# Patient Record
Sex: Male | Born: 1945
Health system: Southern US, Community
[De-identification: ages and names within clinical notes are randomized; demographics above are authoritative.]

## PROBLEM LIST (undated history)

## (undated) DIAGNOSIS — E119 Type 2 diabetes mellitus without complications: Secondary | ICD-10-CM

## (undated) DIAGNOSIS — E785 Hyperlipidemia, unspecified: Secondary | ICD-10-CM

## (undated) DIAGNOSIS — M1990 Unspecified osteoarthritis, unspecified site: Secondary | ICD-10-CM

## (undated) DIAGNOSIS — I639 Cerebral infarction, unspecified: Secondary | ICD-10-CM

## (undated) DIAGNOSIS — R0602 Shortness of breath: Secondary | ICD-10-CM

## (undated) DIAGNOSIS — F32A Depression, unspecified: Secondary | ICD-10-CM

## (undated) DIAGNOSIS — F419 Anxiety disorder, unspecified: Secondary | ICD-10-CM

## (undated) DIAGNOSIS — Z972 Presence of dental prosthetic device (complete) (partial): Secondary | ICD-10-CM

## (undated) DIAGNOSIS — Z973 Presence of spectacles and contact lenses: Secondary | ICD-10-CM

## (undated) DIAGNOSIS — A770 Spotted fever due to Rickettsia rickettsii: Secondary | ICD-10-CM

## (undated) DIAGNOSIS — I10 Essential (primary) hypertension: Secondary | ICD-10-CM

## (undated) HISTORY — DX: Cerebral infarction, unspecified: I63.9

## (undated) HISTORY — DX: Hyperlipidemia, unspecified: E78.5

## (undated) HISTORY — DX: Type 2 diabetes mellitus without complications: E11.9

## (undated) HISTORY — PX: KNEE SURGERY: SHX244

## (undated) HISTORY — DX: Unspecified osteoarthritis, unspecified site: M19.90

---

## 2002-04-01 ENCOUNTER — Emergency Department (HOSPITAL_COMMUNITY): Admission: EM | Admit: 2002-04-01 | Discharge: 2002-04-01 | Payer: Self-pay | Admitting: Emergency Medicine

## 2002-09-04 ENCOUNTER — Inpatient Hospital Stay (HOSPITAL_COMMUNITY): Admission: EM | Admit: 2002-09-04 | Discharge: 2002-09-04 | Payer: Self-pay | Admitting: Emergency Medicine

## 2002-09-04 ENCOUNTER — Encounter: Payer: Self-pay | Admitting: Emergency Medicine

## 2005-01-24 ENCOUNTER — Emergency Department (HOSPITAL_COMMUNITY): Admission: EM | Admit: 2005-01-24 | Discharge: 2005-01-24 | Payer: Self-pay | Admitting: Family Medicine

## 2011-01-10 ENCOUNTER — Other Ambulatory Visit: Payer: Self-pay | Admitting: Family Medicine

## 2011-01-10 ENCOUNTER — Ambulatory Visit
Admission: RE | Admit: 2011-01-10 | Discharge: 2011-01-10 | Disposition: A | Discharge status: Home / Self Care | Payer: Medicare Other | Source: Ambulatory Visit | Attending: Family Medicine | Admitting: Family Medicine

## 2011-01-10 DIAGNOSIS — R0602 Shortness of breath: Secondary | ICD-10-CM

## 2011-07-12 ENCOUNTER — Encounter: Payer: Self-pay | Admitting: Internal Medicine

## 2011-07-15 ENCOUNTER — Encounter: Payer: Self-pay | Admitting: Internal Medicine

## 2011-07-15 ENCOUNTER — Ambulatory Visit: Payer: Medicare Other | Admitting: Internal Medicine

## 2011-07-15 ENCOUNTER — Ambulatory Visit (INDEPENDENT_AMBULATORY_CARE_PROVIDER_SITE_OTHER): Payer: Medicare Other | Admitting: Internal Medicine

## 2011-07-15 DIAGNOSIS — E785 Hyperlipidemia, unspecified: Secondary | ICD-10-CM

## 2011-07-15 DIAGNOSIS — R0789 Other chest pain: Secondary | ICD-10-CM

## 2011-07-15 DIAGNOSIS — I119 Hypertensive heart disease without heart failure: Secondary | ICD-10-CM

## 2011-07-15 NOTE — Progress Notes (Signed)
HPI  Patient notes feeling a sponge like  Sensation in chart.  Has slacked off some .  Comes on with and without activity. With exertion gets dizzy and SOB. 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  Lifts heavy things  Gets dizzy.  A little SOB.  No sponge like sensation Occasionally wakes up SOB.  Hx of DM x 15 years.  Diet cntrolled   No Known Allergies  Current Outpatient Prescriptions  Medication Sig Dispense Refill  . allopurinol (ZYLOPRIM) 300 MG tablet Take 300 mg by mouth daily.        Marland Kitchen amLODipine (NORVASC) 10 MG tablet Take 10 mg by mouth daily.        Marland Kitchen aspirin 81 MG tablet Take 81 mg by mouth daily.        . Calcium Carbonate-Vitamin D (CALCIUM-VITAMIN D) 500-200 MG-UNIT per tablet Take 1 tablet by mouth daily.        . carvedilol (COREG) 12.5 MG tablet Take 12.5 mg by mouth 2 (two) times daily with a meal.        . doxazosin (CARDURA) 8 MG tablet Take 16 mg by mouth at bedtime.        . furosemide (LASIX) 20 MG tablet Take 1-2 tabs daily as needed      . Multiple Vitamin (MULTIVITAMIN) tablet Take 1 tablet by mouth daily.        Marland Kitchen oxyCODONE-acetaminophen (PERCOCET) 5-325 MG per tablet Take 1 tablet by mouth every 4 (four) hours as needed.        . ramipril (ALTACE) 10 MG capsule Take 10 mg by mouth 2 (two) times daily.        . simvastatin (ZOCOR) 20 MG tablet Take 20 mg by mouth at bedtime.          No past medical history on file.  Past Surgical History  Procedure Date  . Knee surgery     right x3    Family History  Problem Relation Age of Onset  . Hypertension    . Cancer      History   Social History  . Marital Status: Married    Spouse Name: N/A    Number of Children: N/A  . Years of Education: N/A   Occupational History  . Not on file.   Social History Main Topics  . Smoking status: Never Smoker   . Smokeless tobacco: Current User  . Alcohol Use: Yes  . Drug Use: No  . Sexually Active: Not on file   Other Topics Concern  . Not on file   Social  History Narrative  . No narrative on file    Review of Systems:  All systems reviewed.  They are negative to the above problem except as previously stated.  Vital Signs: BP 117/59  Pulse 63  Ht 5\' 5"  (1.651 m)  Wt 194 lb (87.998 kg)  BMI 32.28 kg/m2  Physical Exam Patient is in NAD  HEENT:  Normocephalic, atraumatic. EOMI, PERRLA.  Neck: JVP is normal. No thyromegaly. No bruits.  Lungs: clear to auscultation. No rales no wheezes.  Heart: Regular rate and rhythm. Normal S1, S2. No S3.   No significant murmurs. PMI not displaced.  Abdomen:  Supple, nontender. Normal bowel sounds. No masses. No hepatomegaly.  Extremities:   Good distal pulses throughout. No lower extremity edema.  Musculoskeletal :moving all extremities.  Neuro:   alert and oriented x3.  CN II-XII grossly intact.  EKG:  NSR>  63 bpm.  Assessment and Plan:

## 2011-07-15 NOTE — Patient Instructions (Signed)
Your physician has requested that you have en exercise stress myoview. For further information please visit [email or web address]. Please follow instruction sheet, as given.   

## 2011-07-22 ENCOUNTER — Ambulatory Visit (HOSPITAL_COMMUNITY): Payer: Medicare Other | Attending: Internal Medicine | Admitting: Radiology

## 2011-07-22 DIAGNOSIS — R0789 Other chest pain: Secondary | ICD-10-CM

## 2011-07-22 DIAGNOSIS — I959 Hypotension, unspecified: Secondary | ICD-10-CM

## 2011-07-22 DIAGNOSIS — R079 Chest pain, unspecified: Secondary | ICD-10-CM | POA: Insufficient documentation

## 2011-07-22 DIAGNOSIS — R0989 Other specified symptoms and signs involving the circulatory and respiratory systems: Secondary | ICD-10-CM

## 2011-07-22 MED ORDER — SODIUM CHLORIDE 0.9 % IV SOLN: INTRAVENOUS | Status: DC

## 2011-07-22 MED ORDER — TECHNETIUM TC 99M TETROFOSMIN IV KIT
33.0000 | PACK | Freq: Once | INTRAVENOUS | Status: AC | PRN
  Administered 2011-07-22: 33 via INTRAVENOUS

## 2011-07-22 MED ORDER — TECHNETIUM TC 99M TETROFOSMIN IV KIT
11.0000 | PACK | Freq: Once | INTRAVENOUS | Status: AC | PRN
  Administered 2011-07-22: 11 via INTRAVENOUS

## 2011-07-22 MED ORDER — REGADENOSON 0.4 MG/5ML IV SOLN
0.4000 mg | Freq: Once | INTRAVENOUS | Status: AC
  Administered 2011-07-22: 0.4 mg via INTRAVENOUS

## 2011-07-22 NOTE — Progress Notes (Signed)
[facility] SITE 3 NUCLEAR MED [address] [place] [address] [phone]  Cardiology Nuclear Med Study  Luis Kim is a 65 y.o. male [id number] 02-Mar-1946   Nuclear Med Background Indication for Stress Test:  Evaluation for Ischemia History:  No previous documented CAD Cardiac Risk Factors: Hypertension, NIDDM, Obesity and Smoker  Symptoms:  Chest Pressure.  (last episode of chest discomfort was about 3-days ago), Diaphoresis, Dizziness, DOE/SOB, Fatigue, Near Syncope and Palpitations    Nuclear Pre-Procedure Caffeine/Decaff Intake:  None NPO After: 6:00 pm   Lungs:  Clear. IV 0.9% NS with Angio Cath:  20g  IV Site: R Hand  IV Started by:  Cathlyn Parsons, RN  Chest Size (in):  42 Cup Size: n/a  Height: 5\' 5"  (1.651 m)  Weight:  192 lb (87.091 kg)  BMI:  Body mass index is 31.95 kg/(m^2). Tech Comments:  Carvedilol held x 24 hrs    Nuclear Med Study 1 or 2 day study: 1 day  Stress Test Type:  Treadmill/Lexiscan  Reading MD: Willa Rough, MD  Order Authorizing Provider:  Dietrich Pates, MD  Resting Radionuclide: Technetium 63m Tetrofosmin  Resting Radionuclide Dose: 11.0 mCi   Stress Radionuclide:  Technetium 77m Tetrofosmin  Stress Radionuclide Dose: 33.0 mCi           Stress Protocol Rest HR: 61 Stress HR: 120  Rest BP: 120/64 Stress BP: 160/59  Exercise Time (min): 7:30 METS: 6.9   Predicted Max HR: 154 bpm % Max HR: 77.92 bpm Rate Pressure Product: 46962   Dose of Adenosine (mg):  n/a Dose of Lexiscan: 0.4 mg  Dose of Atropine (mg): n/a Dose of Dobutamine: n/a mcg/kg/min (at max HR)  Stress Test Technologist: Smiley Houseman, CMA-N  Nuclear Technologist:  Doyne Keel, CNMT     Rest Procedure:  Myocardial perfusion imaging was performed at rest 45 minutes following the intravenous administration of Technetium 69m Tetrofosmin.  Rest ECG: Minor, nonspecific ST changes.  Stress Procedure:  The patient attempted to walk the treadmill  for 5:31 utilizing the Bruce protocol, but was unable to obtain his target heart rate due to moderate dyspnea, O2 Sat was 96%.  He then received IV Lexiscan 0.4 mg over 15-seconds with concurrent low level exercise and then Technetium 73m Tetrofosmin was injected at 30-seconds while the patient continued walking one more minute.  There were no diagnostic ST-T wave changes with Lexiscan.  He did c/o chest tightness, 4/10 with Lexiscan.  He had a hypotensive response in recovery, that was relieved with 500 cc of normal saline.  Quantitative spect images were obtained after a 45-minute delay.  Stress ECG: No significant change from baseline ECG  QPS Raw Data Images:  Patient motion noted; appropriate software correction applied. Stress Images:  Normal homogeneous uptake in all areas of the myocardium. Rest Images:  Normal homogeneous uptake in all areas of the myocardium. Subtraction (SDS):  No evidence of ischemia. Transient Ischemic Dilatation (Normal <1.22):  1.10 Lung/Heart Ratio (Normal <0.45):  0.23  Quantitative Gated Spect Images QGS EDV:  104 ml QGS ESV:  30 ml QGS cine images:  Normal Wall Motion QGS EF: 71%  Impression Exercise Capacity:  Lexiscan with low level exercise. BP Response:  Hypotensive blood pressure response. Clinical Symptoms:  Chest tight ECG Impression:  No significant ST segment change suggestive of ischemia. Comparison with Prior Nuclear Study: No previous nuclear study performed  Overall Impression:  There is no significant EKG abnormality or nuclear abnormality. There  was significant drop in BP post stress. With BP as low as 72/45 with standing, IV fluids were given. Pt stabilized and was allowed to go home. The study had been changed from walking to [place].   Willa Rough

## 2011-07-23 ENCOUNTER — Telehealth: Payer: Self-pay | Admitting: Internal Medicine

## 2011-07-23 ENCOUNTER — Other Ambulatory Visit (HOSPITAL_COMMUNITY): Payer: Self-pay | Admitting: Radiology

## 2011-07-23 DIAGNOSIS — R0602 Shortness of breath: Secondary | ICD-10-CM

## 2011-07-23 DIAGNOSIS — R0789 Other chest pain: Secondary | ICD-10-CM | POA: Insufficient documentation

## 2011-07-23 DIAGNOSIS — E785 Hyperlipidemia, unspecified: Secondary | ICD-10-CM | POA: Insufficient documentation

## 2011-07-23 NOTE — Assessment & Plan Note (Signed)
On statin.  Will need to be followed.

## 2011-07-23 NOTE — Telephone Encounter (Signed)
Test results

## 2011-07-23 NOTE — Telephone Encounter (Signed)
Pt's daughter calling, quite upset, that we could not give her mother,  results of her father's stress test--advised we are not supposed to give results until attending MD has read--daughter continued to state her father had a reaction during the procedure and she wants to know what's going on--after pulling out results of stress test(which shows no evidence of ischemia) i told daughter i would call pt back and tell them results of testing--daughter agrees---nt

## 2011-07-23 NOTE — Assessment & Plan Note (Signed)
I am not convinced the patient's spells represent angina.  I would, with his medical history, set up for a stress myoview to rule out ischemia.  No changes for now.

## 2011-07-23 NOTE — Telephone Encounter (Signed)
Patient would like to know the [facility] results. Test was done yesterday 07/22/11. Pt's wife was made aware that MD needs to review test and make recommendations if needed. We will call them back when is done. Wife verbalized understanding.

## 2011-07-31 NOTE — Telephone Encounter (Signed)
Set up for PFTs.

## 2011-07-31 NOTE — Telephone Encounter (Signed)
Called patient's home and spoke with his wife. He was at work. She is aware for the need for PFT. Will send order for PFT's to [facility]. She will let the daughter know about the plan.

## 2011-07-31 NOTE — Telephone Encounter (Signed)
Patient daughter calling back.

## 2011-07-31 NOTE — Telephone Encounter (Signed)
Pt daughter calling wanting to know more about stress test . Pt daughter wants to know what the plan is from this point forward. Please return call to advise/discuss.

## 2011-08-05 ENCOUNTER — Encounter: Payer: Self-pay | Admitting: Internal Medicine

## 2011-08-05 ENCOUNTER — Telehealth: Payer: Self-pay | Admitting: Internal Medicine

## 2011-08-05 ENCOUNTER — Ambulatory Visit (INDEPENDENT_AMBULATORY_CARE_PROVIDER_SITE_OTHER): Payer: Medicare Other | Admitting: Internal Medicine

## 2011-08-05 DIAGNOSIS — R0989 Other specified symptoms and signs involving the circulatory and respiratory systems: Secondary | ICD-10-CM

## 2011-08-05 DIAGNOSIS — R5383 Other fatigue: Secondary | ICD-10-CM

## 2011-08-05 DIAGNOSIS — R0602 Shortness of breath: Secondary | ICD-10-CM

## 2011-08-05 LAB — PULMONARY FUNCTION TEST

## 2011-08-05 NOTE — Telephone Encounter (Signed)
Kathy(daughter) had left the house. I spoke with patient's wife. She wanted to let us know that he had his PFT's done today. Advised will call back when results are available.

## 2011-08-05 NOTE — Progress Notes (Signed)
PFT done today. 

## 2011-08-05 NOTE — Telephone Encounter (Signed)
Test result

## 2011-08-06 ENCOUNTER — Encounter: Payer: Self-pay | Admitting: Internal Medicine

## 2011-08-08 ENCOUNTER — Ambulatory Visit: Payer: Medicare Other | Admitting: Internal Medicine

## 2011-08-08 ENCOUNTER — Telehealth: Payer: Self-pay | Admitting: Internal Medicine

## 2011-08-08 NOTE — Telephone Encounter (Signed)
PFT not scanned in yet. CDY, have you read this yet? Looks like it was done on 08/05/11. Please advise, thanks!

## 2011-08-08 NOTE — Telephone Encounter (Signed)
DAUGHTER CALLED BACK AGAIN. 'VERY UPSET" THAT IT HAS TAKEN THIS LONG TO GET PFT RESULTS TO DR. Tenny Craw. SHE WILL CALL BACK TOMORROW IN THE MORNING "TO SEE WHAT CAN BE DONE THEN". Luis Kim

## 2011-08-09 ENCOUNTER — Telehealth: Payer: Self-pay | Admitting: Internal Medicine

## 2011-08-09 ENCOUNTER — Encounter: Payer: Self-pay | Admitting: *Deleted

## 2011-08-09 NOTE — Telephone Encounter (Signed)
Dr.Ross spoke with patient's daughter concerning results of PFT's and need for right and left heart cath. I called patient's wife with all instructions. He will come in for labwork on 9/17.

## 2011-08-09 NOTE — Telephone Encounter (Signed)
Reviewed tests results with wife. Echo from outside showed normal LV systolic function.  Mild diastolic dysfunction Myoview:  Got HR to 120.  Not at target so switched to lexiscan.  No evidence of chronotropic incompetence PFTs showed very mild obstructive airway disease that did though have signif response to bronchodilator  Wife reports patient's continued symptoms.  Did say that one day he did look ashen.   Ques if all could be related to agent orange exposure.  Noninvasive tests do not sugg a signif problem, esp to make him look "ashen" at times. Family is very worried. Only way to really confirm would be to do a left and right heart catheterization. Wife agrees (patient at work)  Set up pre cath labs then schedule.  Check TSH as well. If that is neg could consider CT to r/o PE though prob is very low.

## 2011-08-09 NOTE — Telephone Encounter (Signed)
Error.Luis Kim ° °

## 2011-08-09 NOTE — Telephone Encounter (Signed)
Pt daughter called again wants pft results.Luis Kim

## 2011-08-09 NOTE — Telephone Encounter (Signed)
Advised the pt daughter that we have refaxed the pft results to Dr. Tenny Craw and received a confirmation from fax that is went through. She states she will call Dr. Tenny Craw now. Carron Curie, CMA

## 2011-08-09 NOTE — Telephone Encounter (Signed)
Pt daughter calling to report that she spoke with pulmonary technician regarding pt pulmonary function test and stated . Pt daughter would like results of that test discussed with pt wife. Please call pt wife to discuss test results.

## 2011-08-09 NOTE — Telephone Encounter (Signed)
Spoke with patient's wife again. He will come in for lab work on 9/19 and have heart cath on 9/20.

## 2011-08-14 ENCOUNTER — Encounter: Payer: Self-pay | Admitting: Internal Medicine

## 2011-08-14 ENCOUNTER — Other Ambulatory Visit (INDEPENDENT_AMBULATORY_CARE_PROVIDER_SITE_OTHER): Payer: Medicare Other | Admitting: *Deleted

## 2011-08-14 DIAGNOSIS — R079 Chest pain, unspecified: Secondary | ICD-10-CM

## 2011-08-14 DIAGNOSIS — R0602 Shortness of breath: Secondary | ICD-10-CM

## 2011-08-14 DIAGNOSIS — R5383 Other fatigue: Secondary | ICD-10-CM

## 2011-08-14 LAB — CBC WITH DIFFERENTIAL/PLATELET
Basophils Absolute: 0 10*3/uL (ref 0.0–0.1)
Eosinophils Absolute: 0.2 10*3/uL (ref 0.0–0.7)
Hemoglobin: 12.5 g/dL — ABNORMAL LOW (ref 13.0–17.0)
Lymphocytes Relative: 18.2 % (ref 12.0–46.0)
MCHC: 34.1 g/dL (ref 30.0–36.0)
Monocytes Relative: 8.2 % (ref 3.0–12.0)
Neutro Abs: 5.3 10*3/uL (ref 1.4–7.7)
Neutrophils Relative %: 70.2 % (ref 43.0–77.0)
RDW: 13.6 % (ref 11.5–14.6)

## 2011-08-14 LAB — PROTIME-INR
INR: 0.9 ratio (ref 0.8–1.0)
Prothrombin Time: 10.6 s (ref 10.2–12.4)

## 2011-08-14 LAB — BASIC METABOLIC PANEL
CO2: 27 mEq/L (ref 19–32)
Calcium: 8.4 mg/dL (ref 8.4–10.5)
Chloride: 103 mEq/L (ref 96–112)
Sodium: 138 mEq/L (ref 135–145)

## 2011-08-14 LAB — SEDIMENTATION RATE: Sed Rate: 36 mm/hr — ABNORMAL HIGH (ref 0–22)

## 2011-08-14 LAB — TSH: TSH: 0.99 u[IU]/mL (ref 0.35–5.50)

## 2011-08-14 LAB — APTT: aPTT: 25.4 s (ref 21.7–28.8)

## 2011-08-15 ENCOUNTER — Inpatient Hospital Stay (HOSPITAL_BASED_OUTPATIENT_CLINIC_OR_DEPARTMENT_OTHER)
Admission: RE | Admit: 2011-08-15 | Discharge: 2011-08-15 | Disposition: A | Discharge status: Home / Self Care | Payer: Medicare Other | Source: Ambulatory Visit | Attending: Cardiovascular Disease | Admitting: Cardiovascular Disease

## 2011-08-15 DIAGNOSIS — I2510 Atherosclerotic heart disease of native coronary artery without angina pectoris: Secondary | ICD-10-CM | POA: Insufficient documentation

## 2011-08-15 DIAGNOSIS — R079 Chest pain, unspecified: Secondary | ICD-10-CM | POA: Insufficient documentation

## 2011-08-16 NOTE — Cardiovascular Report (Signed)
NAMEMarland Kim  [place], CARRAS NO.:  [id number]  MEDICAL RECORD NO.:  [id number]  LOCATION:                                 FACILITY:  PHYSICIAN:  Veverly Fells. Excell Seltzer, MD  DATE OF BIRTH:  1945/12/02  DATE OF PROCEDURE:  08/15/2011 DATE OF DISCHARGE:                           CARDIAC CATHETERIZATION   PROCEDURES:  Right heart catheterization, left heart catheterization, selective coronary angiography, left ventricular angiography.  PROCEDURAL INDICATION:  Mr. [name] is a 65 year old gentleman with longstanding diabetes, hypertension, and hyperlipidemia.  He has had an unremarkable noninvasive workup but has continued symptoms which are very bothersome, and he was referred for definitive diagnosis.  Right and left heart cath was requested.  Risks and indications of the procedure were reviewed with the patient, informed consent was obtained.  The right groin was prepped, draped, and anesthetized with 1% lidocaine.  Using modified Seldinger technique, a 4- French sheath was placed in the right femoral artery and a 6-French sheath was placed in the right femoral vein.  Standard Judkins catheters were used for coronary angiography and left ventriculography.  A multipurpose catheter was used for the right heart catheterization. There were no immediate complications.  PROCEDURAL FINDINGS:  Hemodynamics:  Right atrial pressure mean of 7, right ventricular pressure 27/10, pulmonary artery pressure 25/10 with a mean of 17, pulmonary capillary wedge pressure mean of 15, aortic pressure 125/56 with a mean of 83, left ventricular pressure is 121/70.  Oxygen saturations:  Aortic saturation is 96%.  Pulmonary artery saturation is 73%.  Cardiac output is 6.2 liters per minute.  Cardiac index is 3.2 liters per minute per meter squared.  Angiography:  Left ventriculography shows normal left ventricular systolic function.  The ejection fraction is estimated at 60-65%.  There are  no regional wall motion abnormalities identified.  Coronary angiography:  The left mainstem is widely patent.  There is no significant obstructive disease.  The left main trifurcates into the LAD, an intermediate branch, and the left circumflex.  LAD.  The LAD is a large-caliber vessel proximally and tapers after the first diagonal to a moderate-caliber vessel that wraps around the LV apex.  There is no significant obstructive disease throughout the course of the LAD distribution or its diagonal branches.  Left circumflex.  There is a small to moderate caliber intermediate branch with no significant stenosis.  The AV groove circumflex is large. It gives off a tiny first OM and a moderate-caliber third OM.  The OM branches in the AV groove circumflex are all widely patent without significant obstructive disease.  Right coronary artery.  The RCA is dominant.  There is mild diffuse plaque through the mid vessel with 20-30% stenosis.  The proximal and distal vessel are widely patent.  The vessel terminates in a large PDA branch with no significant obstructive disease.  FINAL ASSESSMENT: 1. Minimal nonobstructive coronary artery disease with 20-30% stenosis     in the mid right coronary artery and widely patent left main, left     anterior descending, and left circumflex. 2. Normal left ventricular systolic function. 3. Normal right heart hemodynamics with normal intracardiac filling     pressures.     [name]  Karren Burly, MD     MDC/MEDQ  D:  08/15/2011  T:  08/15/2011  Job:  [id number]  cc:   Pricilla Riffle, MD, [facility] Burnell Blanks, MD  Electronically Signed by Tonny Bollman MD on 08/16/2011 09:19:05 PM

## 2011-08-19 LAB — POCT I-STAT 3, VENOUS BLOOD GAS (G3P V)
TCO2: 30 mmol/L (ref 0–100)
pCO2, Ven: 46.1 mmHg (ref 45.0–50.0)
pH, Ven: 7.398 — ABNORMAL HIGH (ref 7.250–7.300)
pO2, Ven: 39 mmHg (ref 30.0–45.0)

## 2011-08-19 LAB — POCT I-STAT 3, ART BLOOD GAS (G3+)
Bicarbonate: 27.8 mEq/L — ABNORMAL HIGH (ref 20.0–24.0)
pO2, Arterial: 79 mmHg — ABNORMAL LOW (ref 80.0–100.0)

## 2011-08-26 ENCOUNTER — Other Ambulatory Visit: Payer: Self-pay | Admitting: *Deleted

## 2011-08-26 ENCOUNTER — Telehealth: Payer: Self-pay | Admitting: Internal Medicine

## 2011-08-26 DIAGNOSIS — I2510 Atherosclerotic heart disease of native coronary artery without angina pectoris: Secondary | ICD-10-CM

## 2011-08-26 DIAGNOSIS — R0602 Shortness of breath: Secondary | ICD-10-CM

## 2011-08-26 NOTE — Telephone Encounter (Signed)
Pt wife returning call from [place] from Friday. Please return call to discuss further.

## 2011-08-26 NOTE — Telephone Encounter (Signed)
Called patient's wife. He will come for labs on 10/8 in this office.

## 2011-08-26 NOTE — Telephone Encounter (Signed)
Called patient's wife and advised of the need for more lab work. She will call me back to let me know when he can come in.

## 2011-08-26 NOTE — Telephone Encounter (Signed)
Pt's wife called and said that husband will come in for labs on Monday Oct 8 in the am.

## 2011-09-02 ENCOUNTER — Other Ambulatory Visit (INDEPENDENT_AMBULATORY_CARE_PROVIDER_SITE_OTHER): Payer: Medicare Other | Admitting: *Deleted

## 2011-09-02 DIAGNOSIS — R0602 Shortness of breath: Secondary | ICD-10-CM

## 2011-09-02 DIAGNOSIS — I2510 Atherosclerotic heart disease of native coronary artery without angina pectoris: Secondary | ICD-10-CM

## 2011-09-02 LAB — CBC WITH DIFFERENTIAL/PLATELET
Basophils Absolute: 0 10*3/uL (ref 0.0–0.1)
Eosinophils Relative: 3.4 % (ref 0.0–5.0)
HCT: 37.2 % — ABNORMAL LOW (ref 39.0–52.0)
Hemoglobin: 12.7 g/dL — ABNORMAL LOW (ref 13.0–17.0)
Lymphs Abs: 1.4 10*3/uL (ref 0.7–4.0)
Monocytes Relative: 6.6 % (ref 3.0–12.0)
Neutro Abs: 6.4 10*3/uL (ref 1.4–7.7)
RDW: 13.2 % (ref 11.5–14.6)

## 2011-09-05 ENCOUNTER — Telehealth: Payer: Self-pay | Admitting: Internal Medicine

## 2011-09-05 NOTE — Telephone Encounter (Signed)
Pt was called by someone and they don't know who or why.  He has an appt. Tomorrow but just was concerned that they might have been calling with results of lab.  Please call cell [phone]

## 2011-09-05 NOTE — Telephone Encounter (Signed)
Spoke with patient's daughter. See note attached to labs from 10/9.

## 2011-09-06 ENCOUNTER — Encounter: Payer: Self-pay | Admitting: Internal Medicine

## 2011-09-06 ENCOUNTER — Ambulatory Visit (INDEPENDENT_AMBULATORY_CARE_PROVIDER_SITE_OTHER): Payer: Medicare Other | Admitting: Internal Medicine

## 2011-09-06 DIAGNOSIS — R0789 Other chest pain: Secondary | ICD-10-CM

## 2011-09-06 DIAGNOSIS — R0989 Other specified symptoms and signs involving the circulatory and respiratory systems: Secondary | ICD-10-CM

## 2011-09-06 DIAGNOSIS — R0602 Shortness of breath: Secondary | ICD-10-CM

## 2011-09-06 DIAGNOSIS — I119 Hypertensive heart disease without heart failure: Secondary | ICD-10-CM

## 2011-09-06 DIAGNOSIS — R0609 Other forms of dyspnea: Secondary | ICD-10-CM

## 2011-09-06 LAB — BASIC METABOLIC PANEL
BUN: 26 mg/dL — ABNORMAL HIGH (ref 6–23)
Chloride: 103 mEq/L (ref 96–112)
Glucose, Bld: 125 mg/dL — ABNORMAL HIGH (ref 70–99)
Potassium: 4 mEq/L (ref 3.5–5.1)

## 2011-09-06 LAB — LIPID PANEL
HDL: 71 mg/dL (ref >39.00)
LDL Cholesterol: 50 mg/dL (ref 0–99)
Total CHOL/HDL Ratio: 2
Triglycerides: 128 mg/dL (ref 0.0–149.0)
VLDL: 25.6 mg/dL (ref 0.0–40.0)

## 2011-09-06 NOTE — Patient Instructions (Signed)
Refer to Dr.Clint Young for Pulmonary Consult for SOB.  Lab work today. We will call you with results.

## 2011-09-06 NOTE — Progress Notes (Signed)
HPI Patient is a 65 year old who I saw for the first time in August.  He had complained of SOB and squishy sensation and chest tightness. He had an echo at an outside institution that showed normal LVEF with mild diastolic dysfunction.  I  I saw him and scheduled a myoview that was normal.  He then had  PFTs that showed only mild reactive airways.    Because his wife and daughter said that his spells were so severe I recomm labs.  D dimer was normal  WESR was mildly elevated.  I also recomm a cardiac cat to fully define.  This showed no significant CAD and normal R heart pressures.Marland Kitchen SInce seen he has had continued episodes where he has to take a bigger breath.  Gives out easier.   No Known Allergies  Current Outpatient Prescriptions  Medication Sig Dispense Refill  . allopurinol (ZYLOPRIM) 300 MG tablet Take 300 mg by mouth daily.        Marland Kitchen amLODipine (NORVASC) 10 MG tablet Take 10 mg by mouth daily.        Marland Kitchen aspirin 81 MG tablet Take 81 mg by mouth daily.        . Calcium Carbonate-Vitamin D (CALCIUM-VITAMIN D) 500-200 MG-UNIT per tablet Take 1 tablet by mouth daily.        . carvedilol (COREG) 12.5 MG tablet Take 12.5 mg by mouth 2 (two) times daily with a meal.        . doxazosin (CARDURA) 8 MG tablet Take 16 mg by mouth at bedtime.        . furosemide (LASIX) 20 MG tablet Take 1-2 tabs daily as needed      . Multiple Vitamin (MULTIVITAMIN) tablet Take 1 tablet by mouth daily.        Marland Kitchen oxyCODONE-acetaminophen (PERCOCET) 5-325 MG per tablet Take 1 tablet by mouth every 4 (four) hours as needed.        . ramipril (ALTACE) 10 MG capsule Take 10 mg by mouth 2 (two) times daily.        . simvastatin (ZOCOR) 20 MG tablet Take 20 mg by mouth at bedtime.          No past medical history on file.  Past Surgical History  Procedure Date  . Knee surgery     right x3    Family History  Problem Relation Age of Onset  . Hypertension    . Cancer      History   Social History  . Marital  Status: Married    Spouse Name: N/A    Number of Children: N/A  . Years of Education: N/A   Occupational History  . Not on file.   Social History Main Topics  . Smoking status: Never Smoker   . Smokeless tobacco: Current User  . Alcohol Use: Yes  . Drug Use: No  . Sexually Active: Not on file   Other Topics Concern  . Not on file   Social History Narrative  . No narrative on file    Review of Systems:  All systems reviewed.  They are negative to the above problem except as previously stated.  Vital Signs: BP 118/62  Pulse 75  Ht 5\' 5"  (1.651 m)  Wt 195 lb (88.451 kg)  BMI 32.45 kg/m2  Physical Exam  Patietnt is in  NAD  HEENT:  Normocephalic, atraumatic. EOMI, PERRLA.  Neck: JVP is normal. No thyromegaly. No bruits.  Lungs: clear to auscultation. No  rales no wheezes.  Heart: Regular rate and rhythm. Normal S1, S2. No S3.   No significant murmurs. PMI not displaced.  Abdomen:  Supple, nontender. Normal bowel sounds. No masses. No hepatomegaly.  Extremities:   Good distal pulses throughout. No lower extremity edema.  Musculoskeletal :moving all extremities.  Neuro:   alert and oriented x3.  CN II-XII grossly intact.   Assessment and Plan:

## 2011-09-08 NOTE — Assessment & Plan Note (Signed)
I am not sure what is causing the patient's symptoms.  No signif CAD or pulmonary HTN.  D dimer was normal.  ESR was minimally elevated.  PFTs with only mild abnormality The patient and his family (wife and daughter) remain anxious.  Will repeat ESR>  I have asked them to get echo disk for review. I would also recomm eval in pulmonary. I have encouraged him to lose wt as probably will help symptoms.

## 2011-09-12 ENCOUNTER — Telehealth: Payer: Self-pay | Admitting: Internal Medicine

## 2011-09-12 NOTE — Telephone Encounter (Signed)
Daughter calls today about ? Test results pt had recently.  She said her father wasn't sure what tests he had. Lab results given to daughter. Mylo Red RN

## 2011-09-12 NOTE — Telephone Encounter (Signed)
Pt's dtr calling for pt re results, pt didn't understand

## 2011-10-15 ENCOUNTER — Ambulatory Visit (INDEPENDENT_AMBULATORY_CARE_PROVIDER_SITE_OTHER): Payer: Medicare Other | Admitting: Internal Medicine

## 2011-10-15 ENCOUNTER — Encounter: Payer: Self-pay | Admitting: Internal Medicine

## 2011-10-15 VITALS — BP 122/62 | HR 73 | Ht 65.0 in | Wt 195.8 lb

## 2011-10-15 DIAGNOSIS — R0789 Other chest pain: Secondary | ICD-10-CM

## 2011-10-15 MED ORDER — CILIDINIUM-CHLORDIAZEPOXIDE 2.5-5 MG PO CAPS: 1.0000 | ORAL_CAPSULE | Freq: Three times a day (TID) | ORAL | Status: AC | PRN

## 2011-10-15 NOTE — Patient Instructions (Signed)
Script sent to [facility] for Librax to try- may cause drowsiness  Sample albuterol rescue inhaler     Try 2 puffs, up to 4 times daily as needed for tightness/ shortness of breath

## 2011-10-15 NOTE — Progress Notes (Signed)
10/15/11-  65 yoM never smoker referred by Dr. Gunnar Fusi Ross/cardiology for evaluation of dyspnea. PCP  Dr Pierce Crane in [place]. Complains of shortness of breath over the past 6-9 months intermittently. He was referred for cardiology workup and told he had an enlarged heart. We are now asked to assess how much lung disease he may have. He denies prior restart her recent health problems. Dyspnea is said to be nonexertional, having occurred at rest and while lying quietly in bed. He feels a substernal pressure without wheeze or cough. Episodes happened 10-15 times a day, relieved by taking a deep breath and standing up. They're not associated with distinct pain, palpitation, lightheadedness or other factor that he recognizes. Onset and termination are not instantaneous but seem brief. He has a history of allergic rhinitis associated with grass pollen. Other medical problems include high blood pressure treated with an ACE inhibitor. He denies dysphagia. There is no history of chest surgery or injury. 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 He admits his work is stressful. Father had emphysema.  Lab work reviewed: PFT 08/10/2011 within normal limits with slight response to bronchodilator. FEV1/FVC 0.70. D-dimer 09/03/2011 0.43 Hemoglobin 09/02/2011 12.7-slight anemia  Sedimentation rate-elevated over past 2 months, 34, 36 ANA-negative B. natruretic peptide-55 Chest x-ray 06/14/2011 mild cardiac enlargement otherwise clear Echocardiogram 06/25/2011-mild abnormalities with LVH, diastolic dysfunction, EF 55-60%  ROS-see HPI Constitutional:   No-   weight loss, night sweats, fevers, chills, fatigue, lassitude. HEENT:   No-  headaches, difficulty swallowing, tooth/dental problems, sore throat,       No-  sneezing, itching, ear ache, nasal congestion, post nasal drip,  CV:  No-   chest pain,  orthopnea, PND, swelling in lower extremities, anasarca,                                  dizziness, palpitations Resp: Per HPI-  shortness of breath with exertion or at rest.              No-   productive cough,  No non-productive cough,  No- coughing up of blood.              No-   change in color of mucus.  No- wheezing.   Skin: No-   rash or lesions. GI:  No-   heartburn, indigestion, abdominal pain, nausea, vomiting, diarrhea,                 change in bowel habits, loss of appetite GU: No-   dysuria, change in color of urine, no urgency or frequency.  No- flank pain. MS:  No-   joint pain or swelling.  No- decreased range of motion.  No- back pain. Neuro-     nothing unusual Psych:  No- change in mood or affect. No depression or anxiety.  No memory loss.  OBJ General- Alert, Oriented, Affect-appropriate, Distress- none acute, stocky Skin- rash-none, lesions- none, excoriation- none Lymphadenopathy- none Head- atraumatic            Eyes- Gross vision intact, PERRLA, conjunctivae clear secretions            Ears- Hearing, canals-normal            Nose- Clear, no-Septal dev, mucus, polyps, erosion, perforation  Throat- Mallampati II , mucosa clear , drainage- none, tonsils- atrophic Neck- flexible , trachea midline, no stridor , thyroid nl, carotid no bruit Chest - symmetrical excursion , unlabored           Heart/CV- RRR , no murmur , no gallop  , no rub, nl s1 s2                           - JVD- none , edema- none, stasis changes- none, varices- none           Lung- clear to P&A, wheeze- none, cough- none , dullness-none, rub- none           Chest wall-  Abd- tender-no, distended-no, bowel sounds-present, HSM- no Br/ Gen/ Rectal- Not done, not indicated Extrem- cyanosis- none, clubbing, none, atrophy- none, strength- nl Neuro- grossly intact to observation      .

## 2011-10-19 NOTE — Assessment & Plan Note (Signed)
Episodic substernal tightness, squeezing pressure, dyspnea. Musculoskeletal discomfort related to arthritic nerve compression in the spine can give this sensation but it is unlikely. Usually these symptoms will come from intermittent tightness in the airway, heart or esophagus. Cardiac evaluation has not shown obvious problems sufficient to explain the symptoms. Pulmonary function tests and chest x-ray suggest only mild intermittent small airway spasm. We can assess the reversible airway question with a rescue inhaler. I would like to see if access to an anxiolytic antispasmodic preparation Librax has any effect. Consider a methacholine inhalation challenge.

## 2011-11-15 ENCOUNTER — Ambulatory Visit: Payer: Medicare Other | Admitting: Internal Medicine

## 2011-11-27 DIAGNOSIS — D649 Anemia, unspecified: Secondary | ICD-10-CM | POA: Diagnosis not present

## 2011-11-27 DIAGNOSIS — E7800 Pure hypercholesterolemia, unspecified: Secondary | ICD-10-CM | POA: Diagnosis not present

## 2011-11-27 DIAGNOSIS — E119 Type 2 diabetes mellitus without complications: Secondary | ICD-10-CM | POA: Diagnosis not present

## 2011-11-27 DIAGNOSIS — Z79899 Other long term (current) drug therapy: Secondary | ICD-10-CM | POA: Diagnosis not present

## 2011-11-27 DIAGNOSIS — Z6831 Body mass index (BMI) 31.0-31.9, adult: Secondary | ICD-10-CM | POA: Diagnosis not present

## 2011-11-27 DIAGNOSIS — Z125 Encounter for screening for malignant neoplasm of prostate: Secondary | ICD-10-CM | POA: Diagnosis not present

## 2011-11-27 DIAGNOSIS — I10 Essential (primary) hypertension: Secondary | ICD-10-CM | POA: Diagnosis not present

## 2011-11-27 DIAGNOSIS — M109 Gout, unspecified: Secondary | ICD-10-CM | POA: Diagnosis not present

## 2011-12-04 DIAGNOSIS — J309 Allergic rhinitis, unspecified: Secondary | ICD-10-CM | POA: Diagnosis not present

## 2012-01-01 ENCOUNTER — Telehealth: Payer: Self-pay | Admitting: Internal Medicine

## 2012-01-01 NOTE — Telephone Encounter (Signed)
New Problem   [name] Luis Kim returning nurse call for patient, she can be reached on cell# [phone]

## 2012-01-02 NOTE — Telephone Encounter (Signed)
Called patient's wife. She wants to know when he needs to be seen again. Advised will ask Dr.Ross and call him back.

## 2012-01-06 NOTE — Telephone Encounter (Signed)
Can set f/u for this spring.

## 2012-01-06 NOTE — Telephone Encounter (Signed)
Called patient's wife back and advised that he needs to be seen in April. Set for 4/18 315 pm with Dr.Ross.

## 2012-01-16 DIAGNOSIS — J209 Acute bronchitis, unspecified: Secondary | ICD-10-CM | POA: Diagnosis not present

## 2012-03-12 ENCOUNTER — Ambulatory Visit: Payer: Medicare Other | Admitting: Internal Medicine

## 2012-05-26 DIAGNOSIS — D649 Anemia, unspecified: Secondary | ICD-10-CM | POA: Diagnosis not present

## 2012-05-26 DIAGNOSIS — M109 Gout, unspecified: Secondary | ICD-10-CM | POA: Diagnosis not present

## 2012-05-26 DIAGNOSIS — E7800 Pure hypercholesterolemia, unspecified: Secondary | ICD-10-CM | POA: Diagnosis not present

## 2012-05-26 DIAGNOSIS — E119 Type 2 diabetes mellitus without complications: Secondary | ICD-10-CM | POA: Diagnosis not present

## 2012-05-26 DIAGNOSIS — I10 Essential (primary) hypertension: Secondary | ICD-10-CM | POA: Diagnosis not present

## 2012-08-08 DIAGNOSIS — H2589 Other age-related cataract: Secondary | ICD-10-CM | POA: Diagnosis not present

## 2012-08-08 DIAGNOSIS — H5200 Hypermetropia, unspecified eye: Secondary | ICD-10-CM | POA: Diagnosis not present

## 2012-11-06 DIAGNOSIS — M1710 Unilateral primary osteoarthritis, unspecified knee: Secondary | ICD-10-CM | POA: Diagnosis not present

## 2013-01-21 DIAGNOSIS — M109 Gout, unspecified: Secondary | ICD-10-CM | POA: Diagnosis not present

## 2013-01-21 DIAGNOSIS — Z125 Encounter for screening for malignant neoplasm of prostate: Secondary | ICD-10-CM | POA: Diagnosis not present

## 2013-01-21 DIAGNOSIS — R209 Unspecified disturbances of skin sensation: Secondary | ICD-10-CM | POA: Diagnosis not present

## 2013-01-21 DIAGNOSIS — E7800 Pure hypercholesterolemia, unspecified: Secondary | ICD-10-CM | POA: Diagnosis not present

## 2013-07-21 DIAGNOSIS — E119 Type 2 diabetes mellitus without complications: Secondary | ICD-10-CM | POA: Diagnosis not present

## 2013-07-21 DIAGNOSIS — M109 Gout, unspecified: Secondary | ICD-10-CM | POA: Diagnosis not present

## 2013-07-21 DIAGNOSIS — Z9181 History of falling: Secondary | ICD-10-CM | POA: Diagnosis not present

## 2013-07-21 DIAGNOSIS — E7800 Pure hypercholesterolemia, unspecified: Secondary | ICD-10-CM | POA: Diagnosis not present

## 2013-07-21 DIAGNOSIS — Z5181 Encounter for therapeutic drug level monitoring: Secondary | ICD-10-CM | POA: Diagnosis not present

## 2013-07-21 DIAGNOSIS — Z0000 Encounter for general adult medical examination without abnormal findings: Secondary | ICD-10-CM | POA: Diagnosis not present

## 2013-08-18 DIAGNOSIS — I10 Essential (primary) hypertension: Secondary | ICD-10-CM | POA: Diagnosis not present

## 2013-09-13 DIAGNOSIS — Z5181 Encounter for therapeutic drug level monitoring: Secondary | ICD-10-CM | POA: Diagnosis not present

## 2013-09-24 DIAGNOSIS — K4090 Unilateral inguinal hernia, without obstruction or gangrene, not specified as recurrent: Secondary | ICD-10-CM | POA: Diagnosis not present

## 2013-09-30 ENCOUNTER — Ambulatory Visit (INDEPENDENT_AMBULATORY_CARE_PROVIDER_SITE_OTHER): Payer: Medicare Other | Admitting: General Surgery

## 2013-10-07 ENCOUNTER — Encounter (INDEPENDENT_AMBULATORY_CARE_PROVIDER_SITE_OTHER): Payer: Self-pay | Admitting: General Surgery

## 2013-10-07 ENCOUNTER — Ambulatory Visit (INDEPENDENT_AMBULATORY_CARE_PROVIDER_SITE_OTHER): Payer: Medicare Other | Admitting: General Surgery

## 2013-10-07 VITALS — BP 130/68 | HR 64 | Temp 97.1°F | Resp 14 | Ht 66.0 in | Wt 169.0 lb

## 2013-10-07 DIAGNOSIS — K4090 Unilateral inguinal hernia, without obstruction or gangrene, not specified as recurrent: Secondary | ICD-10-CM | POA: Diagnosis not present

## 2013-10-07 NOTE — Progress Notes (Signed)
Patient ID: Luis Kim, male   DOB: 1946-06-13, 67 y.o.   MRN: [id number]  Chief Complaint  Patient presents with  . New Evaluation    eval RIH    HPI Luis Kim is a 67 y.o. male.  He is referred by Dr. Nathanial Rancher of [facility] in [place] for evaluation of a symptomatic right inguinal hernia.  The patient has noticed a bulge in his right groin for 2-1/2 years. If he gets constipated it bulges more. It causes pain when he is straining or lifts anything. It has never been incarcerated. He has no prior history of hernia.  Comorbidities include history of TIA without residual, hypertension, osteoarthritis, gout, obesity.  He is retired Hotel manager.  HPI  Past Medical History  Diagnosis Date  . Arthritis   . Diabetes mellitus without complication   . Hyperlipidemia   . Stroke     Past Surgical History  Procedure Laterality Date  . Knee surgery      right x3    Family History  Problem Relation Age of Onset  . Hypertension    . Cancer    . Cancer Mother     ovarian  . Cancer Father     leukemia    Social History History  Substance Use Topics  . Smoking status: Never Smoker   . Smokeless tobacco: Current User    Types: Chew  . Alcohol Use: Yes    No Known Allergies  Current Outpatient Prescriptions  Medication Sig Dispense Refill  . allopurinol (ZYLOPRIM) 300 MG tablet Take 300 mg by mouth daily.        Marland Kitchen amLODipine (NORVASC) 10 MG tablet Take 10 mg by mouth daily.        Marland Kitchen aspirin 81 MG tablet Take 81 mg by mouth daily.        . Calcium Carbonate-Vitamin D (CALCIUM-VITAMIN D) 500-200 MG-UNIT per tablet Take 1 tablet by mouth daily.        . carvedilol (COREG) 12.5 MG tablet Take 12.5 mg by mouth 2 (two) times daily with a meal.        . doxazosin (CARDURA) 8 MG tablet Take 16 mg by mouth at bedtime.        . furosemide (LASIX) 20 MG tablet Take 1-2 tabs daily as needed      . Multiple Vitamin (MULTIVITAMIN) tablet Take 1 tablet by mouth  daily.        Marland Kitchen oxyCODONE-acetaminophen (PERCOCET) 5-325 MG per tablet Take 1 tablet by mouth every 4 (four) hours as needed.        . ramipril (ALTACE) 10 MG capsule Take 10 mg by mouth 2 (two) times daily.        . simvastatin (ZOCOR) 20 MG tablet Take 20 mg by mouth at bedtime.         No current facility-administered medications for this visit.    Review of Systems Review of Systems  Constitutional: Negative for fever, chills and unexpected weight change.  HENT: Negative for congestion, hearing loss, sore throat, trouble swallowing and voice change.   Eyes: Negative for visual disturbance.  Respiratory: Negative for cough and wheezing.   Cardiovascular: Negative for chest pain, palpitations and leg swelling.  Gastrointestinal: Positive for constipation. Negative for nausea, vomiting, abdominal pain, diarrhea, blood in stool, abdominal distention, anal bleeding and rectal pain.  Genitourinary: Negative for hematuria and difficulty urinating.  Musculoskeletal: Positive for arthralgias.  Skin: Negative for rash and wound.  Neurological: Negative  for seizures, syncope, weakness and headaches.  Hematological: Negative for adenopathy. Does not bruise/bleed easily.  Psychiatric/Behavioral: Negative for confusion.    Blood pressure 130/68, pulse 64, temperature 97.1 F (36.2 C), temperature source Temporal, resp. rate 14, height 5\' 6"  (1.676 m), weight 169 lb (76.658 kg).  Physical Exam Physical Exam  Constitutional: He is oriented to person, place, and time. He appears well-developed and well-nourished. No distress.  HENT:  Head: Normocephalic.  Nose: Nose normal.  Mouth/Throat: No oropharyngeal exudate.  Eyes: Conjunctivae and EOM are normal. Pupils are equal, round, and reactive to light. Right eye exhibits no discharge. Left eye exhibits no discharge. No scleral icterus.  Neck: Normal range of motion. Neck supple. No JVD present. No tracheal deviation present. No thyromegaly  present.  Cardiovascular: Normal rate, regular rhythm, normal heart sounds and intact distal pulses.   No murmur heard. Pulmonary/Chest: Effort normal and breath sounds normal. No stridor. No respiratory distress. He has no wheezes. He has no rales. He exhibits no tenderness.  Abdominal: Soft. Bowel sounds are normal. He exhibits no distension and no mass. There is no tenderness. There is no rebound and no guarding.  Genitourinary:  Medium to large sized right inguinal hernia, does not extend past the external inguinal ring. Reducible when supine. No evidence of hernia on the left. No scrotal mass. Umbilicus normal.  Musculoskeletal: Normal range of motion. He exhibits no edema and no tenderness.  Lymphadenopathy:    He has no cervical adenopathy.  Neurological: He is alert and oriented to person, place, and time. He has normal reflexes. Coordination normal.  Skin: Skin is warm and dry. No rash noted. He is not diaphoretic. No erythema. No pallor.  Psychiatric: He has a normal mood and affect. His behavior is normal. Judgment and thought content normal.    Data Reviewed Office notes from Dr. Nathanial Rancher  Assessment    Symptomatic right inguinal hernia, elective repair with mesh is appropriate at this time  Mild obesity  Gout  Hypertension  History TIA without residual  Osteoarthritis     Plan    I offered and advised him to have this repaired with an open technique with mesh. I discussed the indications, details, techniques, and numerous risk of the surgery with him. He is aware of the risk of bleeding, infection, injury to adjacent organs, testicular damage, nerve damage with chronic pain, recurrence. He understands all these issues. All his questions were answered.  He states that he did not want to have surgery at this time, but would like to have this repaired sometime next year, perhaps after February.  I advised him to call the office and set up a preop appointment so I could  reexamine him to make sure it had not changed and then we will proceed with scheduling of his open right inguinal hernia repair with mesh.        Angelia Mould. Derrell Lolling, M.D., [facility] General and Minimally invasive Surgery Breast and Colorectal Surgery Office:   [phone] Pager:   [phone]  10/07/2013, 11:29 AM

## 2013-10-07 NOTE — Patient Instructions (Signed)
You have a right inguinal hernia that is causing your pain. We are able to push it  back in, so there is no immediate danger.  Because you are beginning to have pain, you should have an operation to repair the hernia with mesh, as we discussed.  Please call us back when you are ready to schedule surgery. We will plan to have you come by the office for a quick exam pre-op to make sure that it has not changed significantly.     Inguinal Hernia, Adult Muscles help keep everything in the body in its proper place. But if a weak spot in the muscles develops, something can poke through. That is called a hernia. When this happens in the lower part of the belly (abdomen), it is called an inguinal hernia. (It takes its name from a part of the body in this region called the inguinal canal.) A weak spot in the wall of muscles lets some fat or part of the small intestine bulge through. An inguinal hernia can develop at any age. Men get them more often than women. CAUSES  In adults, an inguinal hernia develops over time.  It can be triggered by:  Suddenly straining the muscles of the lower abdomen.  Lifting heavy objects.  Straining to have a bowel movement. Difficult bowel movements (constipation) can lead to this.  Constant coughing. This may be caused by smoking or lung disease.  Being overweight.  Being pregnant.  Working at a job that requires long periods of standing or heavy lifting.  Having had an inguinal hernia before. One type can be an emergency situation. It is called a strangulated inguinal hernia. It develops if part of the small intestine slips through the weak spot and cannot get back into the abdomen. The blood supply can be cut off. If that happens, part of the intestine may die. This situation requires emergency surgery. SYMPTOMS  Often, a small inguinal hernia has no symptoms. It is found when a healthcare provider does a physical exam. Larger hernias usually have symptoms.     In adults, symptoms may include:  A lump in the groin. This is easier to see when the person is standing. It might disappear when lying down.  In men, a lump in the scrotum.  Pain or burning in the groin. This occurs especially when lifting, straining or coughing.  A dull ache or feeling of pressure in the groin.  Signs of a strangulated hernia can include:  A bulge in the groin that becomes very painful and tender to the touch.  A bulge that turns red or purple.  Fever, nausea and vomiting.  Inability to have a bowel movement or to pass gas. DIAGNOSIS  To decide if you have an inguinal hernia, a healthcare provider will probably do a physical examination.  This will include asking questions about any symptoms you have noticed.  The healthcare provider might feel the groin area and ask you to cough. If an inguinal hernia is felt, the healthcare provider may try to slide it back into the abdomen.  Usually no other tests are needed. TREATMENT  Treatments can vary. The size of the hernia makes a difference. Options include:  Watchful waiting. This is often suggested if the hernia is small and you have had no symptoms.  No medical procedure will be done unless symptoms develop.  You will need to watch closely for symptoms. If any occur, contact your healthcare provider right away.  Surgery. This is used  if the hernia is larger or you have symptoms.  Open surgery. This is usually an outpatient procedure (you will not stay overnight in a hospital). An cut (incision) is made through the skin in the groin. The hernia is put back inside the abdomen. The weak area in the muscles is then repaired by herniorrhaphy or hernioplasty. Herniorrhaphy: in this type of surgery, the weak muscles are sewn back together. Hernioplasty: a patch or mesh is used to close the weak area in the abdominal wall.  Laparoscopy. In this procedure, a surgeon makes small incisions. A thin tube with a tiny  video camera (called a laparoscope) is put into the abdomen. The surgeon repairs the hernia with mesh by looking with the video camera and using two long instruments. HOME CARE INSTRUCTIONS   After surgery to repair an inguinal hernia:  You will need to take pain medicine prescribed by your healthcare provider. Follow all directions carefully.  You will need to take care of the wound from the incision.  Your activity will be restricted for awhile. This will probably include no heavy lifting for several weeks. You also should not do anything too active for a few weeks. When you can return to work will depend on the type of job that you have.  During "watchful waiting" periods, you should:  Maintain a healthy weight.  Eat a diet high in fiber (fruits, vegetables and whole grains).  Drink plenty of fluids to avoid constipation. This means drinking enough water and other liquids to keep your urine clear or pale yellow.  Do not lift heavy objects.  Do not stand for long periods of time.  Quit smoking. This should keep you from developing a frequent cough. SEEK MEDICAL CARE IF:   A bulge develops in your groin area.  You feel pain, a burning sensation or pressure in the groin. This might be worse if you are lifting or straining.  You develop a fever of more than 100.5 F (38.1 C). SEEK IMMEDIATE MEDICAL CARE IF:   Pain in the groin increases suddenly.  A bulge in the groin gets bigger suddenly and does not go down.  For men, there is sudden pain in the scrotum. Or, the size of the scrotum increases.  A bulge in the groin area becomes red or purple and is painful to touch.  You have nausea or vomiting that does not go away.  You feel your heart beating much faster than normal.  You cannot have a bowel movement or pass gas.  You develop a fever of more than 102.0 F (38.9 C). Document Released: 03/30/2009 Document Revised: 02/03/2012 Document Reviewed: 03/30/2009 [facility]  Patient Information 2014 [place], [place].

## 2014-01-06 DIAGNOSIS — G4700 Insomnia, unspecified: Secondary | ICD-10-CM | POA: Diagnosis not present

## 2014-01-06 DIAGNOSIS — Z6827 Body mass index (BMI) 27.0-27.9, adult: Secondary | ICD-10-CM | POA: Diagnosis not present

## 2014-01-06 DIAGNOSIS — Z9181 History of falling: Secondary | ICD-10-CM | POA: Diagnosis not present

## 2014-01-06 DIAGNOSIS — Z1331 Encounter for screening for depression: Secondary | ICD-10-CM | POA: Diagnosis not present

## 2014-01-26 DIAGNOSIS — Z125 Encounter for screening for malignant neoplasm of prostate: Secondary | ICD-10-CM | POA: Diagnosis not present

## 2014-01-26 DIAGNOSIS — M109 Gout, unspecified: Secondary | ICD-10-CM | POA: Diagnosis not present

## 2014-01-26 DIAGNOSIS — I10 Essential (primary) hypertension: Secondary | ICD-10-CM | POA: Diagnosis not present

## 2014-01-26 DIAGNOSIS — E7800 Pure hypercholesterolemia, unspecified: Secondary | ICD-10-CM | POA: Diagnosis not present

## 2014-01-26 DIAGNOSIS — E119 Type 2 diabetes mellitus without complications: Secondary | ICD-10-CM | POA: Diagnosis not present

## 2014-01-26 DIAGNOSIS — R238 Other skin changes: Secondary | ICD-10-CM | POA: Diagnosis not present

## 2014-03-25 DIAGNOSIS — A770 Spotted fever due to Rickettsia rickettsii: Secondary | ICD-10-CM

## 2014-03-25 HISTORY — DX: Spotted fever due to Rickettsia rickettsii: A77.0

## 2014-04-24 ENCOUNTER — Encounter (HOSPITAL_COMMUNITY): Payer: Self-pay | Admitting: Emergency Medicine

## 2014-04-24 ENCOUNTER — Emergency Department (HOSPITAL_COMMUNITY)
Admission: EM | Admit: 2014-04-24 | Discharge: 2014-04-24 | Disposition: A | Discharge status: Home / Self Care | Payer: Medicare Other | Attending: Emergency Medicine | Admitting: Emergency Medicine

## 2014-04-24 ENCOUNTER — Emergency Department (HOSPITAL_COMMUNITY): Payer: Medicare Other

## 2014-04-24 DIAGNOSIS — W57XXXA Bitten or stung by nonvenomous insect and other nonvenomous arthropods, initial encounter: Secondary | ICD-10-CM | POA: Insufficient documentation

## 2014-04-24 DIAGNOSIS — N6310 Unspecified lump in the right breast, unspecified quadrant: Secondary | ICD-10-CM

## 2014-04-24 DIAGNOSIS — E119 Type 2 diabetes mellitus without complications: Secondary | ICD-10-CM | POA: Diagnosis not present

## 2014-04-24 DIAGNOSIS — R5381 Other malaise: Secondary | ICD-10-CM | POA: Diagnosis not present

## 2014-04-24 DIAGNOSIS — Z8673 Personal history of transient ischemic attack (TIA), and cerebral infarction without residual deficits: Secondary | ICD-10-CM | POA: Diagnosis not present

## 2014-04-24 DIAGNOSIS — R630 Anorexia: Secondary | ICD-10-CM | POA: Diagnosis not present

## 2014-04-24 DIAGNOSIS — E785 Hyperlipidemia, unspecified: Secondary | ICD-10-CM | POA: Diagnosis not present

## 2014-04-24 DIAGNOSIS — Z79899 Other long term (current) drug therapy: Secondary | ICD-10-CM | POA: Insufficient documentation

## 2014-04-24 DIAGNOSIS — Y929 Unspecified place or not applicable: Secondary | ICD-10-CM | POA: Insufficient documentation

## 2014-04-24 DIAGNOSIS — Z8739 Personal history of other diseases of the musculoskeletal system and connective tissue: Secondary | ICD-10-CM | POA: Insufficient documentation

## 2014-04-24 DIAGNOSIS — S30860A Insect bite (nonvenomous) of lower back and pelvis, initial encounter: Secondary | ICD-10-CM | POA: Diagnosis not present

## 2014-04-24 DIAGNOSIS — N63 Unspecified lump in unspecified breast: Secondary | ICD-10-CM | POA: Insufficient documentation

## 2014-04-24 DIAGNOSIS — R5383 Other fatigue: Principal | ICD-10-CM

## 2014-04-24 DIAGNOSIS — E860 Dehydration: Secondary | ICD-10-CM | POA: Insufficient documentation

## 2014-04-24 DIAGNOSIS — R42 Dizziness and giddiness: Secondary | ICD-10-CM | POA: Diagnosis not present

## 2014-04-24 DIAGNOSIS — R531 Weakness: Secondary | ICD-10-CM

## 2014-04-24 DIAGNOSIS — Y939 Activity, unspecified: Secondary | ICD-10-CM | POA: Insufficient documentation

## 2014-04-24 LAB — COMPREHENSIVE METABOLIC PANEL
ALT: 17 U/L (ref 0–53)
AST: 23 U/L (ref 0–37)
Albumin: 3.4 g/dL — ABNORMAL LOW (ref 3.5–5.2)
Alkaline Phosphatase: 40 U/L (ref 39–117)
BILIRUBIN TOTAL: 0.8 mg/dL (ref 0.3–1.2)
BUN: 23 mg/dL (ref 6–23)
CHLORIDE: 98 meq/L (ref 96–112)
CO2: 27 meq/L (ref 19–32)
CREATININE: 0.97 mg/dL (ref 0.50–1.35)
Calcium: 9 mg/dL (ref 8.4–10.5)
GFR, EST NON AFRICAN AMERICAN: 82 mL/min — AB (ref >90)
GLUCOSE: 126 mg/dL — AB (ref 70–99)
Potassium: 3.8 mEq/L (ref 3.7–5.3)
Sodium: 138 mEq/L (ref 137–147)
Total Protein: 6.4 g/dL (ref 6.0–8.3)

## 2014-04-24 LAB — CBC WITH DIFFERENTIAL/PLATELET
BASOS ABS: 0 10*3/uL (ref 0.0–0.1)
Basophils Relative: 1 % (ref 0–1)
EOS PCT: 3 % (ref 0–5)
Eosinophils Absolute: 0.2 10*3/uL (ref 0.0–0.7)
HEMATOCRIT: 33.9 % — AB (ref 39.0–52.0)
HEMOGLOBIN: 12 g/dL — AB (ref 13.0–17.0)
LYMPHS ABS: 1.1 10*3/uL (ref 0.7–4.0)
LYMPHS PCT: 18 % (ref 12–46)
MCH: 32.4 pg (ref 26.0–34.0)
MCHC: 35.4 g/dL (ref 30.0–36.0)
MCV: 91.6 fL (ref 78.0–100.0)
MONO ABS: 0.6 10*3/uL (ref 0.1–1.0)
Monocytes Relative: 10 % (ref 3–12)
NEUTROS ABS: 4.4 10*3/uL (ref 1.7–7.7)
Neutrophils Relative %: 68 % (ref 43–77)
Platelets: 149 10*3/uL — ABNORMAL LOW (ref 150–400)
RBC: 3.7 MIL/uL — AB (ref 4.22–5.81)
RDW: 13.1 % (ref 11.5–15.5)
WBC: 6.3 10*3/uL (ref 4.0–10.5)

## 2014-04-24 LAB — TROPONIN I

## 2014-04-24 LAB — LIPASE, BLOOD: Lipase: 17 U/L (ref 11–59)

## 2014-04-24 LAB — CBG MONITORING, ED: GLUCOSE-CAPILLARY: 139 mg/dL — AB (ref 70–99)

## 2014-04-24 MED ORDER — SODIUM CHLORIDE 0.9 % IV SOLN
1000.0000 mL | INTRAVENOUS | Status: DC
Start: 2014-04-24 — End: 2014-04-24
  Administered 2014-04-24: 1000 mL via INTRAVENOUS

## 2014-04-24 MED ORDER — SODIUM CHLORIDE 0.9 % IV SOLN
1000.0000 mL | Freq: Once | INTRAVENOUS | Status: AC
  Administered 2014-04-24: 1000 mL via INTRAVENOUS

## 2014-04-24 NOTE — ED Notes (Signed)
MD at bedside. Campos  

## 2014-04-24 NOTE — ED Notes (Signed)
Called lab to ensure sample present

## 2014-04-24 NOTE — ED Notes (Signed)
Pt has been experiencing intermittent weakness over the past year, but the past 2 days have been significantly worse. He was cooking a meal and was too weak to finish and even walking is exhausting. PT also states R breast tenderness over the past 2 weeks. Palpable tenderness in R breast (surrounding nipple and RUQ of breast). PT also has palpable R axillary node and tick present in R nipple. States he's pulled 10-15 ticks off of himself in the past month

## 2014-04-24 NOTE — ED Notes (Signed)
Family at bedside.pt.ambulated up and down the hallway pt. Did well.after using the restroom.

## 2014-04-24 NOTE — ED Notes (Signed)
Pt. Stated, I've been more than usual tired and feeling light headed.  My appetite is not good either.

## 2014-04-24 NOTE — ED Notes (Signed)
Let PT know we need urine sample when he can produce one

## 2014-04-24 NOTE — ED Notes (Signed)
Pt. Stated, I had some tingling in my fingers when driving up here.

## 2014-04-25 ENCOUNTER — Other Ambulatory Visit: Payer: Self-pay | Admitting: Family Medicine

## 2014-04-25 ENCOUNTER — Telehealth (HOSPITAL_BASED_OUTPATIENT_CLINIC_OR_DEPARTMENT_OTHER): Payer: Self-pay | Admitting: Emergency Medicine

## 2014-04-25 DIAGNOSIS — N644 Mastodynia: Secondary | ICD-10-CM | POA: Diagnosis not present

## 2014-04-25 DIAGNOSIS — R634 Abnormal weight loss: Secondary | ICD-10-CM | POA: Diagnosis not present

## 2014-04-25 DIAGNOSIS — R599 Enlarged lymph nodes, unspecified: Secondary | ICD-10-CM | POA: Diagnosis not present

## 2014-04-25 DIAGNOSIS — E8809 Other disorders of plasma-protein metabolism, not elsewhere classified: Secondary | ICD-10-CM | POA: Diagnosis not present

## 2014-04-25 DIAGNOSIS — R2230 Localized swelling, mass and lump, unspecified upper limb: Secondary | ICD-10-CM

## 2014-04-25 LAB — ROCKY MTN SPOTTED FVR AB, IGG-BLOOD: RMSF IgG: 6.59 IV — ABNORMAL HIGH

## 2014-04-25 LAB — ROCKY MTN SPOTTED FVR AB, IGM-BLOOD: RMSF IgM: 0.53 IV (ref 0.00–0.89)

## 2014-04-25 LAB — B. BURGDORFI ANTIBODIES: B burgdorferi Ab IgG+IgM: 0.61 {ISR}

## 2014-04-26 NOTE — ED Provider Notes (Signed)
CSN: [id number]     Arrival date & time 04/24/14  1330 History   First MD Initiated Contact with Patient 04/24/14 1344     Chief Complaint  Patient presents with  . Dizziness       HPI Patient is brought to the emergency department because of weakness over the past year this is to be worsening over the past 2 days.  States she's had less energy than before.  He has some lightheadedness.  He reports multiple tick bites over the past several months.  He currently has a tick attached to his right nipple.  He also reports that he has had right breast tenderness of the past 2 weeks.  He's had anorexia.  He denies nausea and vomiting.  Denies diarrhea.  No melena or hematochezia.  He appears or chills.  No chest pain or shortness breath.  Denies abdominal pain.  No headache.  No neck stiffness.  No rash.  No altered bowel status per family.   Past Medical History  Diagnosis Date  . Arthritis   . Diabetes mellitus without complication   . Hyperlipidemia   . Stroke    Past Surgical History  Procedure Laterality Date  . Knee surgery      right x3   Family History  Problem Relation Age of Onset  . Hypertension    . Cancer    . Cancer Mother     ovarian  . Cancer Father     leukemia   History  Substance Use Topics  . Smoking status: Never Smoker   . Smokeless tobacco: Current User    Types: Chew  . Alcohol Use: Yes    Review of Systems  All other systems reviewed and are negative.     Allergies  Review of patient's allergies indicates no known allergies.  Home Medications   Prior to Admission medications   Medication Sig Start Date End Date Taking? Authorizing Provider  allopurinol (ZYLOPRIM) 300 MG tablet Take 300 mg by mouth daily.     Yes Historical Provider, MD  amLODipine (NORVASC) 10 MG tablet Take 10 mg by mouth daily.     Yes Historical Provider, MD  Calcium Carbonate-Vitamin D (CALCIUM-VITAMIN D) 500-200 MG-UNIT per tablet Take 1 tablet by mouth 2 (two) times  daily.    Yes Historical Provider, MD  carvedilol (COREG) 12.5 MG tablet Take 12.5 mg by mouth 2 (two) times daily with a meal.     Yes Historical Provider, MD  fluticasone (FLONASE) 50 MCG/ACT nasal spray Place 1 spray into both nostrils daily as needed for allergies or rhinitis.   Yes Historical Provider, MD  furosemide (LASIX) 20 MG tablet Take 40 mg by mouth daily as needed for fluid.    Yes Historical Provider, MD  HYDROcodone-acetaminophen (NORCO/VICODIN) 5-325 MG per tablet Take 1 tablet by mouth every 6 (six) hours as needed for moderate pain.   Yes Historical Provider, MD  ramipril (ALTACE) 10 MG capsule Take 20 mg by mouth daily.    Yes Historical Provider, MD  simvastatin (ZOCOR) 20 MG tablet Take 20 mg by mouth at bedtime.     Yes Historical Provider, MD  tetrahydrozoline (VISINE) 0.05 % ophthalmic solution Place 1 drop into both eyes daily as needed (for irritation).   Yes Historical Provider, MD   BP 118/62  Pulse 76  Temp(Src) 98.4 F (36.9 C) (Oral)  Resp 17  SpO2 99% Physical Exam  Nursing note and vitals reviewed. Constitutional: He is oriented to person,  place, and time. He appears well-developed and well-nourished.  HENT:  Head: Normocephalic and atraumatic.  Eyes: EOM are normal.  Neck: Normal range of motion.  Cardiovascular: Normal rate, regular rhythm, normal heart sounds and intact distal pulses.   Pulmonary/Chest: Effort normal and breath sounds normal. No respiratory distress.  Tick attached to his right nipple  Abdominal: Soft. He exhibits no distension. There is no tenderness.  Musculoskeletal: Normal range of motion.  Neurological: He is alert and oriented to person, place, and time.  Skin: Skin is warm and dry.  Psychiatric: He has a normal mood and affect. Judgment normal.    ED Course  FOREIGN BODY REMOVAL Date/Time: 04/26/2014 5:19 AM Performed by: Hoy Morn Authorized by: Hoy Morn Consent: Verbal consent obtained. Consent given by:  patient Intake: Right Nipple. Anesthesia method: none. Complexity: simple 1 objects recovered. Objects recovered: Tick Post-procedure assessment: foreign body removed Patient tolerance: Patient tolerated the procedure well with no immediate complications.   (including critical care time) Labs Review Labs Reviewed  CBC WITH DIFFERENTIAL - Abnormal; Notable for the following:    RBC 3.70 (*)    Hemoglobin 12.0 (*)    HCT 33.9 (*)    Platelets 149 (*)    All other components within normal limits  COMPREHENSIVE METABOLIC PANEL - Abnormal; Notable for the following:    Glucose, Bld 126 (*)    Albumin 3.4 (*)    GFR calc non Af Amer 82 (*)    All other components within normal limits  ROCKY MTN SPOTTED FVR AB, IGG-BLOOD - Abnormal; Notable for the following:    RMSF IgG 6.59 (*)    All other components within normal limits  CBG MONITORING, ED - Abnormal; Notable for the following:    Glucose-Capillary 139 (*)    All other components within normal limits  LIPASE, BLOOD  TROPONIN I  ROCKY MTN SPOTTED FVR AB, IGM-BLOOD  B. BURGDORFI ANTIBODIES    Imaging Review Dg Chest 2 View  04/24/2014   CLINICAL DATA:  Dizzy lightheaded  EXAM: CHEST  2 VIEW  COMPARISON:  05/2011  FINDINGS: The heart size and mediastinal contours are within normal limits. Both lungs are clear. The visualized skeletal structures are unremarkable.  IMPRESSION: No active cardiopulmonary disease.   Electronically Signed   By: Skipper Cliche M.D.   On: 04/24/2014 15:28     EKG Interpretation   Date/Time:  Sunday Apr 24 2014 14:20:49 EDT Ventricular Rate:  61 PR Interval:  175 QRS Duration: 101 QT Interval:  422 QTC Calculation: 425 R Axis:   24 Text Interpretation:  Sinus rhythm Low voltage, extremity leads No  significant change was found Confirmed by Kentrel Clevenger  MD, Lennette Bihari ([id number]) on  04/24/2014 4:18:34 PM      MDM   Final diagnoses:  Weakness  Dehydration  Anorexia  Breast mass, right  Tick bite     Patient will follow closely with his primary care physician.  Patient be sent to the breast center [facility] for additional testing regarding his right breast.  Patient and family understand that although his workup is complete in the emergency department today his overall workup is not complete and additional followup is necessary.    Hoy Morn, MD 04/26/14 [phone]

## 2014-04-26 NOTE — Telephone Encounter (Signed)
+  RMSF. Chart sent to [place] office for review.

## 2014-04-28 ENCOUNTER — Telehealth (HOSPITAL_COMMUNITY): Payer: Self-pay

## 2014-04-28 DIAGNOSIS — Z1212 Encounter for screening for malignant neoplasm of rectum: Secondary | ICD-10-CM | POA: Diagnosis not present

## 2014-05-04 ENCOUNTER — Ambulatory Visit
Admission: RE | Admit: 2014-05-04 | Discharge: 2014-05-04 | Disposition: A | Discharge status: Home / Self Care | Payer: Medicare Other | Source: Ambulatory Visit | Attending: Family Medicine | Admitting: Family Medicine

## 2014-05-04 ENCOUNTER — Other Ambulatory Visit: Payer: Self-pay | Admitting: Family Medicine

## 2014-05-04 DIAGNOSIS — N644 Mastodynia: Secondary | ICD-10-CM

## 2014-05-04 DIAGNOSIS — R2230 Localized swelling, mass and lump, unspecified upper limb: Secondary | ICD-10-CM

## 2014-05-04 DIAGNOSIS — N62 Hypertrophy of breast: Secondary | ICD-10-CM | POA: Diagnosis not present

## 2014-05-04 DIAGNOSIS — M5137 Other intervertebral disc degeneration, lumbosacral region: Secondary | ICD-10-CM | POA: Diagnosis not present

## 2014-05-04 DIAGNOSIS — M545 Low back pain, unspecified: Secondary | ICD-10-CM

## 2014-05-16 ENCOUNTER — Other Ambulatory Visit: Payer: Self-pay | Admitting: Family Medicine

## 2014-05-16 DIAGNOSIS — M545 Low back pain: Secondary | ICD-10-CM

## 2014-05-16 DIAGNOSIS — Z139 Encounter for screening, unspecified: Secondary | ICD-10-CM

## 2014-05-17 ENCOUNTER — Other Ambulatory Visit: Payer: Medicare Other

## 2014-05-23 ENCOUNTER — Ambulatory Visit
Admission: RE | Admit: 2014-05-23 | Discharge: 2014-05-23 | Disposition: A | Discharge status: Home / Self Care | Payer: Medicare Other | Source: Ambulatory Visit | Attending: Family Medicine | Admitting: Family Medicine

## 2014-05-23 DIAGNOSIS — M545 Low back pain: Secondary | ICD-10-CM

## 2014-05-23 DIAGNOSIS — Z135 Encounter for screening for eye and ear disorders: Secondary | ICD-10-CM | POA: Diagnosis not present

## 2014-05-23 DIAGNOSIS — M5126 Other intervertebral disc displacement, lumbar region: Secondary | ICD-10-CM | POA: Diagnosis not present

## 2014-05-23 DIAGNOSIS — M48061 Spinal stenosis, lumbar region without neurogenic claudication: Secondary | ICD-10-CM | POA: Diagnosis not present

## 2014-05-23 DIAGNOSIS — M47817 Spondylosis without myelopathy or radiculopathy, lumbosacral region: Secondary | ICD-10-CM | POA: Diagnosis not present

## 2014-05-23 DIAGNOSIS — Z139 Encounter for screening, unspecified: Secondary | ICD-10-CM

## 2014-05-24 ENCOUNTER — Encounter: Payer: Self-pay | Admitting: Internal Medicine

## 2014-06-13 DIAGNOSIS — M48061 Spinal stenosis, lumbar region without neurogenic claudication: Secondary | ICD-10-CM | POA: Diagnosis not present

## 2014-06-13 DIAGNOSIS — M47817 Spondylosis without myelopathy or radiculopathy, lumbosacral region: Secondary | ICD-10-CM | POA: Diagnosis not present

## 2014-06-13 DIAGNOSIS — M5126 Other intervertebral disc displacement, lumbar region: Secondary | ICD-10-CM | POA: Diagnosis not present

## 2014-06-13 DIAGNOSIS — Z6828 Body mass index (BMI) 28.0-28.9, adult: Secondary | ICD-10-CM | POA: Diagnosis not present

## 2014-08-03 DIAGNOSIS — E7800 Pure hypercholesterolemia, unspecified: Secondary | ICD-10-CM | POA: Diagnosis not present

## 2014-08-03 DIAGNOSIS — E119 Type 2 diabetes mellitus without complications: Secondary | ICD-10-CM | POA: Diagnosis not present

## 2014-08-03 DIAGNOSIS — Z79899 Other long term (current) drug therapy: Secondary | ICD-10-CM | POA: Diagnosis not present

## 2014-08-03 DIAGNOSIS — I10 Essential (primary) hypertension: Secondary | ICD-10-CM | POA: Diagnosis not present

## 2014-08-03 DIAGNOSIS — M109 Gout, unspecified: Secondary | ICD-10-CM | POA: Diagnosis not present

## 2014-08-23 IMAGING — CR DG LUMBAR SPINE COMPLETE 4+V
5 series · 5 of 5 positions shown · non-contrast
Comparison: None.

CLINICAL DATA: Lumbago

EXAM:
LUMBAR SPINE - COMPLETE 4+ VIEW

[t l-spine a.p.]
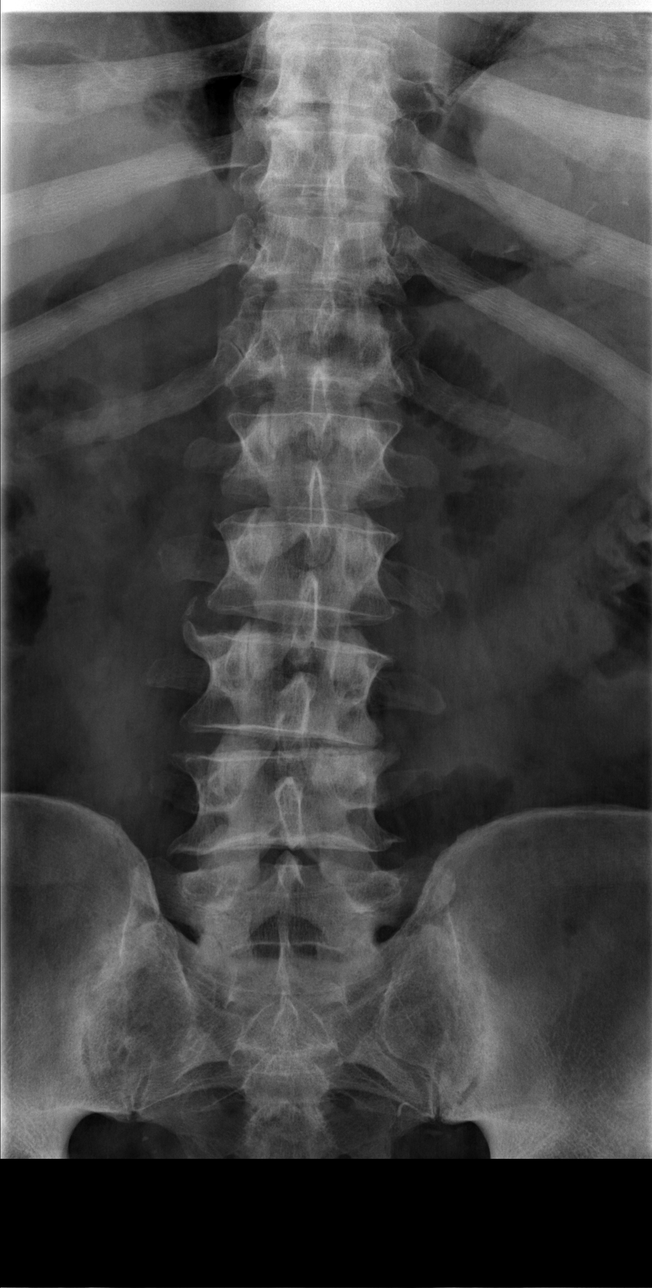

[t l-spine oblique exposure (1 of 2)]
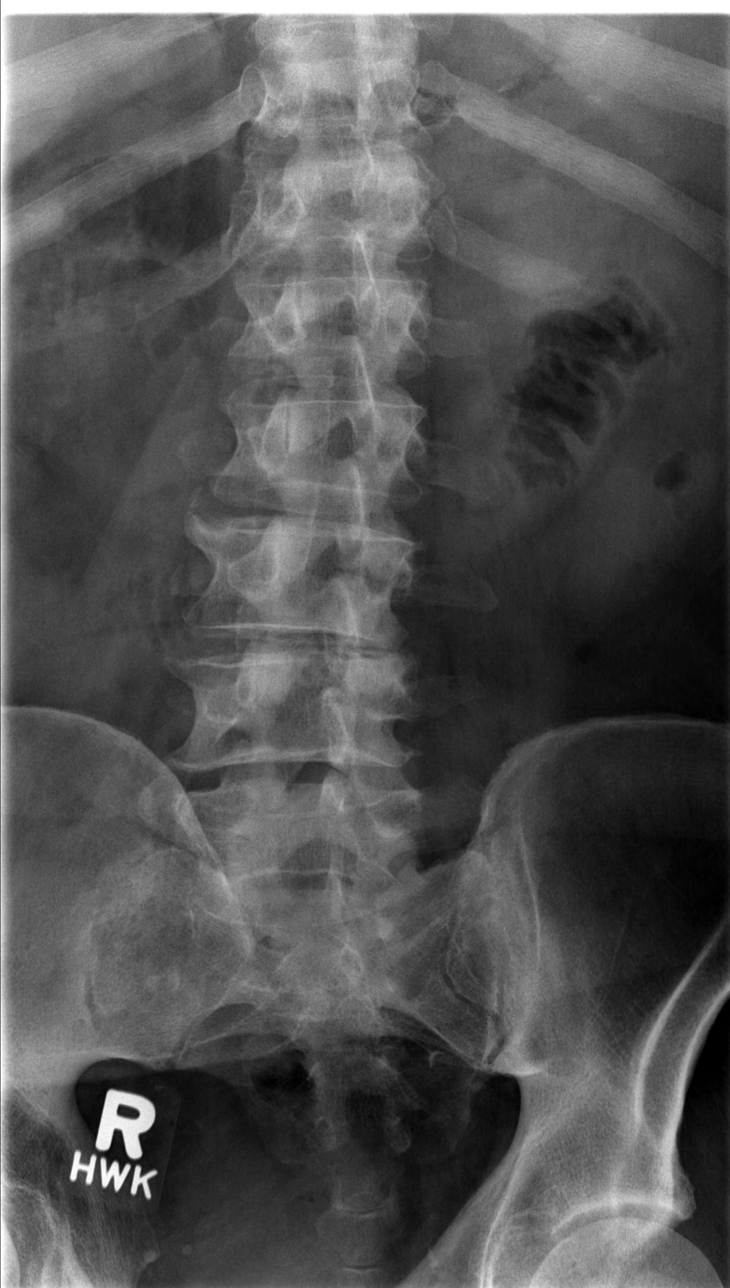

[t l-spine oblique exposure (2 of 2)]
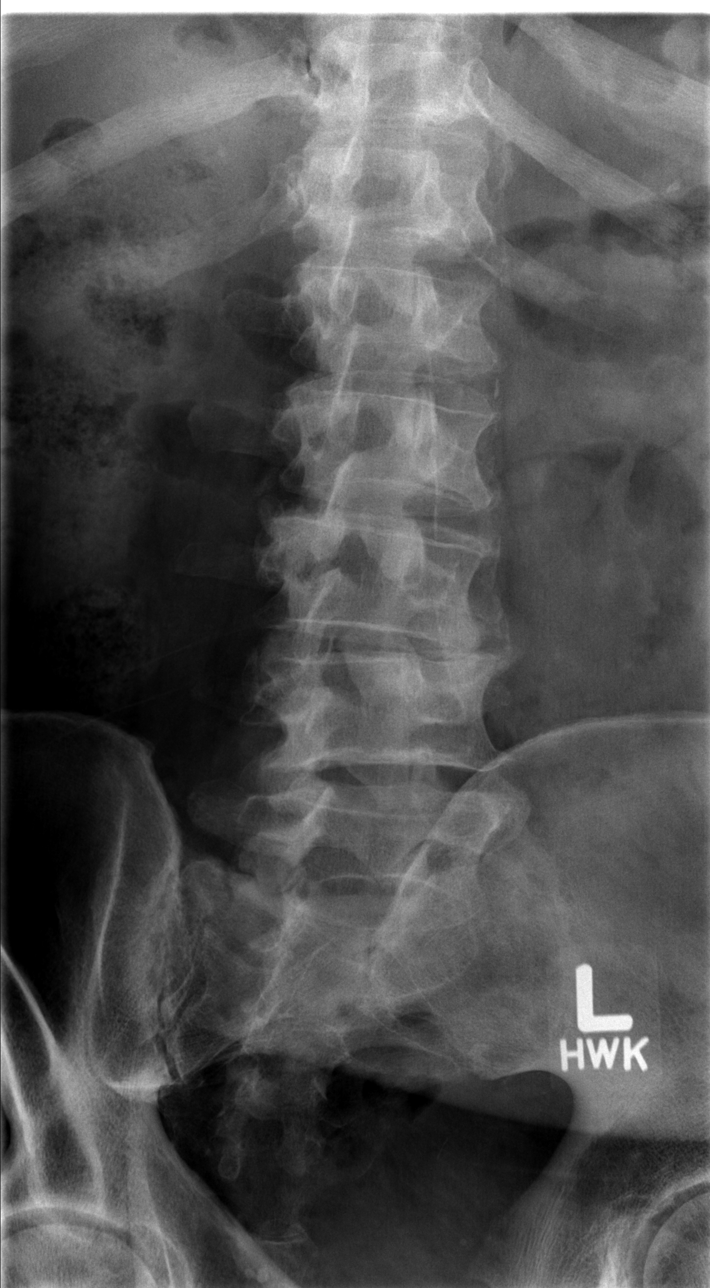

[t l-spine lat]
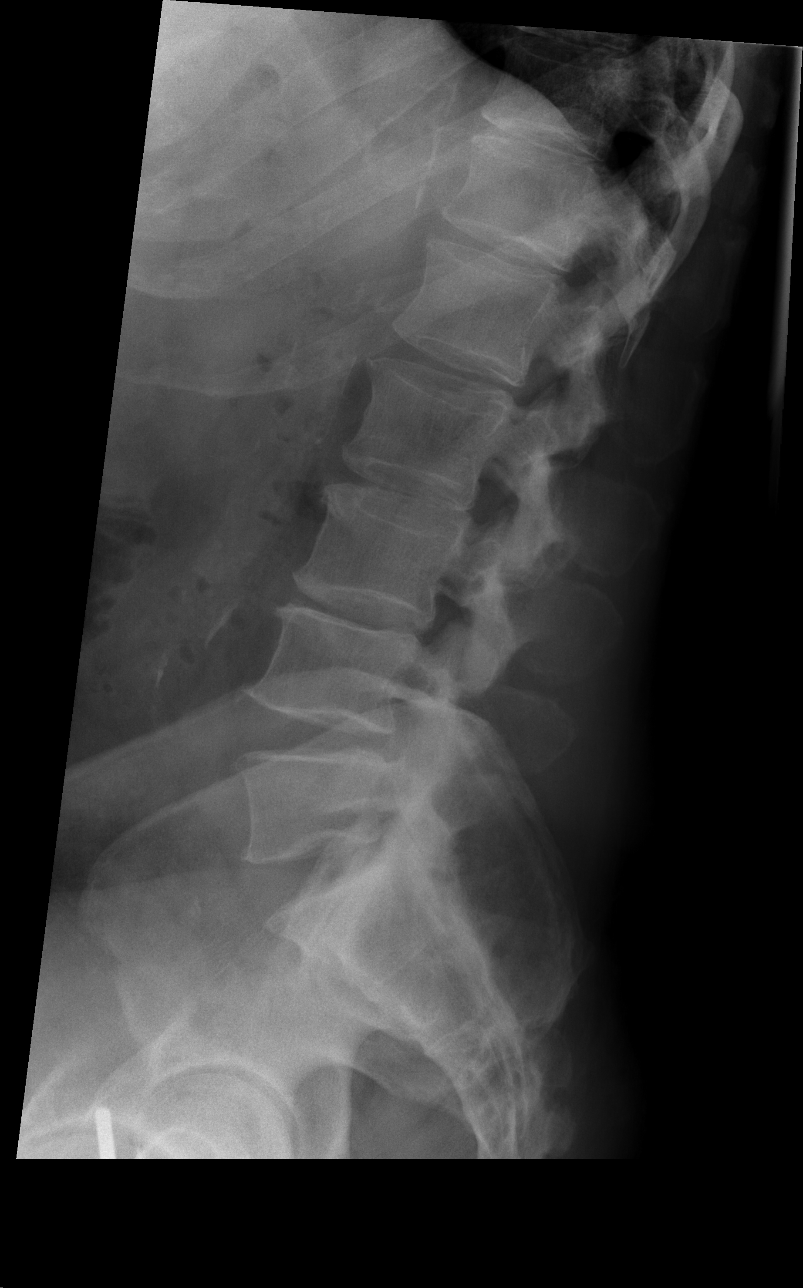

[t l-spine l5-s1 spot]
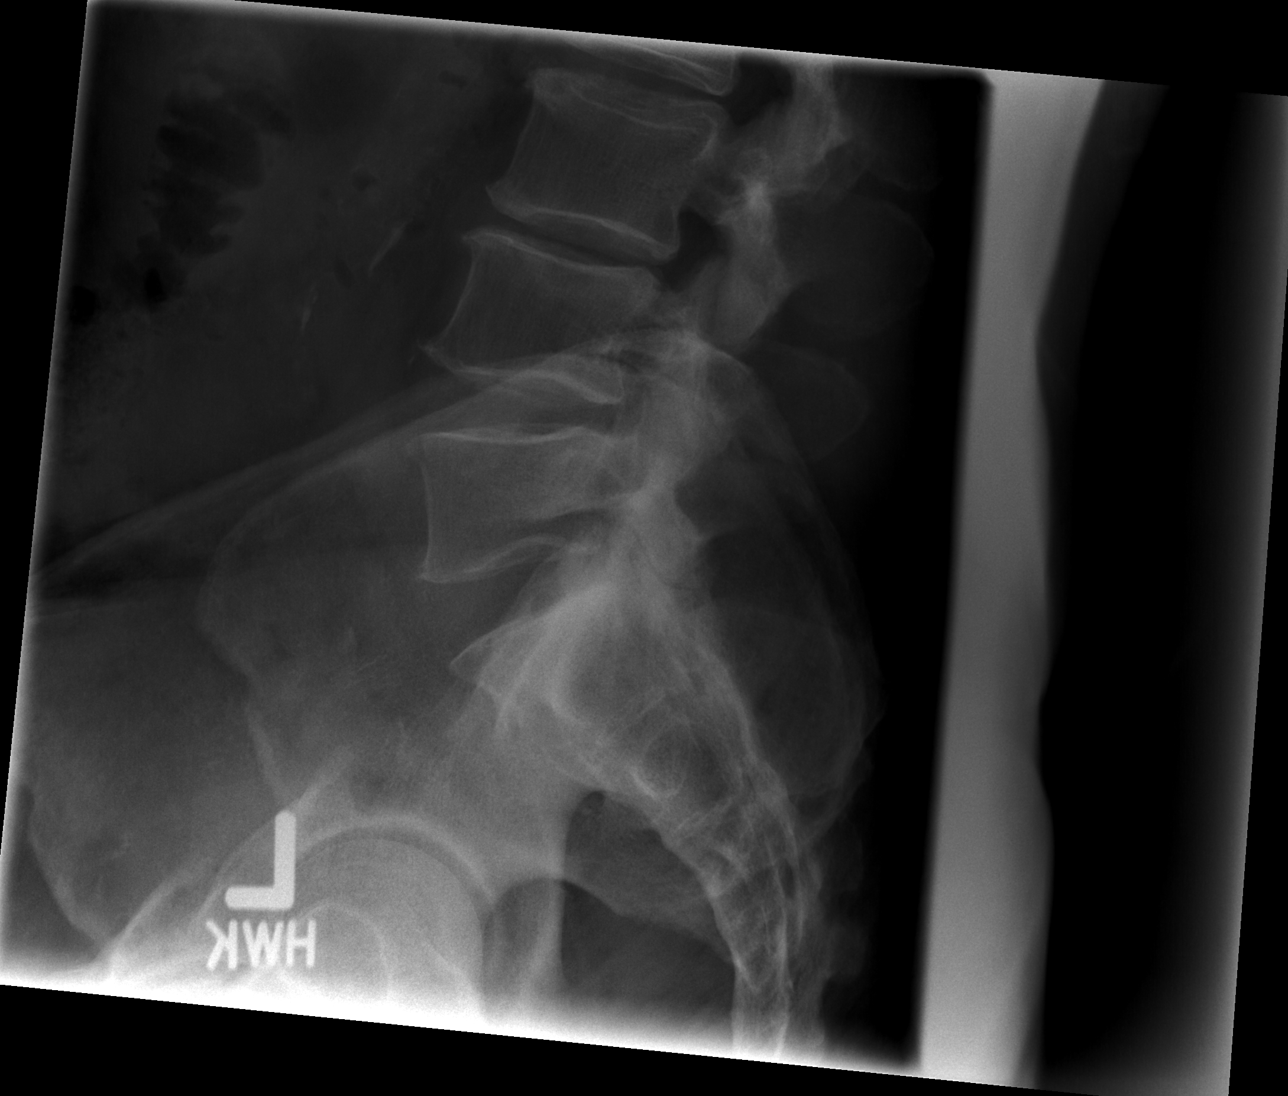

[5 of 5 positions shown; findings below may reference images not displayed]

FINDINGS: There is no evidence of lumbar spine fracture. Alignment is normal.
Multilevel disc space narrowing is appreciated most advanced at the
L3-4 and L4-5 levels.
IMPRESSION: Degenerative disc disease changes within the lumbar spine. No acute
osseous abnormalities.

## 2014-09-01 DIAGNOSIS — Z8673 Personal history of transient ischemic attack (TIA), and cerebral infarction without residual deficits: Secondary | ICD-10-CM | POA: Diagnosis not present

## 2014-09-01 DIAGNOSIS — K4090 Unilateral inguinal hernia, without obstruction or gangrene, not specified as recurrent: Secondary | ICD-10-CM | POA: Diagnosis not present

## 2014-09-01 DIAGNOSIS — I10 Essential (primary) hypertension: Secondary | ICD-10-CM | POA: Diagnosis not present

## 2014-09-01 DIAGNOSIS — M109 Gout, unspecified: Secondary | ICD-10-CM | POA: Diagnosis not present

## 2014-09-01 DIAGNOSIS — E785 Hyperlipidemia, unspecified: Secondary | ICD-10-CM | POA: Diagnosis not present

## 2014-09-08 ENCOUNTER — Encounter (HOSPITAL_BASED_OUTPATIENT_CLINIC_OR_DEPARTMENT_OTHER): Payer: Self-pay | Admitting: *Deleted

## 2014-09-08 NOTE — Progress Notes (Signed)
To come in for bmet Had a cardiac work uo 2012-all neg-pfs done dr Benjiman Core copd-did use rescue inhaler for awhile-not recently-better

## 2014-09-12 ENCOUNTER — Encounter (HOSPITAL_BASED_OUTPATIENT_CLINIC_OR_DEPARTMENT_OTHER)
Admission: RE | Admit: 2014-09-12 | Discharge: 2014-09-12 | Disposition: A | Discharge status: Home / Self Care | Payer: Medicare Other | Source: Ambulatory Visit | Attending: General Surgery | Admitting: General Surgery

## 2014-09-12 DIAGNOSIS — Z8673 Personal history of transient ischemic attack (TIA), and cerebral infarction without residual deficits: Secondary | ICD-10-CM | POA: Diagnosis not present

## 2014-09-12 DIAGNOSIS — Z6828 Body mass index (BMI) 28.0-28.9, adult: Secondary | ICD-10-CM | POA: Diagnosis not present

## 2014-09-12 DIAGNOSIS — E785 Hyperlipidemia, unspecified: Secondary | ICD-10-CM | POA: Diagnosis not present

## 2014-09-12 DIAGNOSIS — M1000 Idiopathic gout, unspecified site: Secondary | ICD-10-CM | POA: Diagnosis not present

## 2014-09-12 DIAGNOSIS — I10 Essential (primary) hypertension: Secondary | ICD-10-CM | POA: Diagnosis not present

## 2014-09-12 DIAGNOSIS — M1990 Unspecified osteoarthritis, unspecified site: Secondary | ICD-10-CM | POA: Diagnosis not present

## 2014-09-12 DIAGNOSIS — E669 Obesity, unspecified: Secondary | ICD-10-CM | POA: Diagnosis not present

## 2014-09-12 DIAGNOSIS — K4090 Unilateral inguinal hernia, without obstruction or gangrene, not specified as recurrent: Secondary | ICD-10-CM | POA: Diagnosis not present

## 2014-09-12 LAB — BASIC METABOLIC PANEL
Anion gap: 12 (ref 5–15)
BUN: 27 mg/dL — AB (ref 6–23)
CALCIUM: 9.1 mg/dL (ref 8.4–10.5)
CO2: 30 mEq/L (ref 19–32)
CREATININE: 0.92 mg/dL (ref 0.50–1.35)
Chloride: 96 mEq/L (ref 96–112)
GFR calc Af Amer: >90 mL/min (ref >90)
GFR, EST NON AFRICAN AMERICAN: 84 mL/min — AB (ref >90)
GLUCOSE: 113 mg/dL — AB (ref 70–99)
Potassium: 4.1 mEq/L (ref 3.7–5.3)
Sodium: 138 mEq/L (ref 137–147)

## 2014-09-12 NOTE — H&P (Signed)
Luis Kim  Location: [facility] Patient #: [phone] DOB: [birth year] Married / Language: English / Race: White Male  History of Present Illness The patient is a 68 year old male who presents with an inguinal hernia. The patient returns to discuss surgical repair of his right inguinal hernia. He would like to do this before Thanksgiving. Originally he was evaluated on 10/07/2013 upon referral by Dr. Lisbeth Ply of [facility] in [place]. He has a 3 year history of right groin bulge. Has been getting larger and more painful. Sometimes he gets as large as a baseball but is usually reducible supine.  Comorbidities include history of TIA without residual, hypertension, hyperlipidemia, osteoarthritis, gout, obesity. He is retired Nature conservation officer.  I think this should be repaired as an open repair because of its size. He will be scheduled for open repair of right renal hernia with mesh. I discussed the indications, details, techniques, and numerous risk of the surgery with him. He is aware of the risk of bleeding, infection, recurrence, nerve damage with chronic pain, injury to adjacent organs such as the bladder or testicle, and unforeseen problems. He understands all of these issues. At this time all of his questions are answered. He agrees with this plan.   Medication History  Allopurinol (300MG  Tablet, Oral daily) Active. Norvasc (10MG  Tablet, Oral daily) Active. Calcium Carbonate-Vitamin D (500-400MG -UNIT Tablet, Oral daily) Active. Coreg (12.5MG  Tablet, Oral daily) Active. Lasix (20MG  Tablet, Oral daily) Active. Altace (10MG  Capsule, Oral daily) Active. Zocor (20MG  Tablet, Oral daily) Active. Flonase (50MCG/ACT Suspension, Nasal daily) Active. Visine Extra (0.05% Solution, Ophthalmic as needed) Active.  Vitals  09/01/2014 8:14 AM Weight: 172 lb Height: 65in Body Surface Area: 1.89 m Body Mass Index: 28.62 kg/m Temp.: 98.50F(Oral)  Pulse:  80 (Regular)  Resp.: 18 (Unlabored)  BP: 118/62 (Sitting, Left Arm, Standard)    Physical Exam  General Mental Status-Alert. General Appearance-Consistent with stated age. Hydration-Well hydrated. Voice-Normal.  Head and Neck Head-normocephalic, atraumatic with no lesions or palpable masses. Trachea-midline. Thyroid Gland Characteristics - normal size and consistency.  Eye Eyeball - Bilateral-Extraocular movements intact. Sclera/Conjunctiva - Bilateral-No scleral icterus.  Chest and Lung Exam Chest and lung exam reveals -quiet, even and easy respiratory effort with no use of accessory muscles and on auscultation, normal breath sounds, no adventitious sounds and normal vocal resonance. Inspection Chest Wall - Normal. Back - normal.  Breast Breast - Left-Symmetric, Non Tender, No Biopsy scars, no Dimpling, No Inflammation, No Lumpectomy scars, No Mastectomy scars, No Peau d' Orange. Breast - Right-Symmetric, Non Tender, No Biopsy scars, no Dimpling, No Inflammation, No Lumpectomy scars, No Mastectomy scars, No Peau d' Orange. Breast Lump-No Palpable Breast Mass.  Cardiovascular Cardiovascular examination reveals -normal heart sounds, regular rate and rhythm with no murmurs and normal pedal pulses bilaterally.  Abdomen Inspection Inspection of the abdomen reveals - No Hernias. Skin - Scar - no surgical scars. Palpation/Percussion Palpation and Percussion of the abdomen reveal - Soft, Non Tender, No Rebound tenderness, No Rigidity (guarding) and No hepatosplenomegaly. Auscultation Auscultation of the abdomen reveals - Bowel sounds normal. Note: Abdomen is soft. No scars. No hernias. Umbilicus normal.   Male Genitourinary Note: Large right inguinal hernia, reducible when supine. No evidence of left inguinal hernia. Penis scrotum and testes are normal. No scrotal mass.   Neurologic Neurologic evaluation reveals -alert and oriented x 3 with  no impairment of recent or remote memory. Mental Status-Normal.  Musculoskeletal Normal Exam - Left-Upper Extremity Strength Normal and Lower Extremity  Strength Normal. Normal Exam - Right-Upper Extremity Strength Normal and Lower Extremity Strength Normal.  Lymphatic Head & Neck  General Head & Neck Lymphatics: Bilateral - Description - Normal. Axillary  General Axillary Region: Bilateral - Description - Normal. Tenderness - Non Tender. Femoral & Inguinal  Generalized Femoral & Inguinal Lymphatics: Bilateral - Description - Normal. Tenderness - Non Tender.    Assessment & Plan  INGUINAL HERNIA, RIGHT (550.90  K40.90) Impression: Having progressive symptoms. Reducible. We'll schedule for open repair with mesh Current Plans  Schedule for Surgery HYPERTENSION, BENIGN (401.1  I10) GOUT, ARTHRITIS (274.00  M10.9) HYPERLIPIDEMIA, ACQUIRED (272.4  E78.5) HISTORY OF TIA (TRANSIENT ISCHEMIC ATTACK) (V12.54  Z86.73)     Luis Kim. Luis Kim, M.D., [facility] General and Minimally invasive Surgery Breast and Colorectal Surgery Office:   [phone] Pager:   [phone]

## 2014-09-14 ENCOUNTER — Ambulatory Visit (HOSPITAL_BASED_OUTPATIENT_CLINIC_OR_DEPARTMENT_OTHER): Payer: Medicare Other | Admitting: Anesthesiology

## 2014-09-14 ENCOUNTER — Encounter (HOSPITAL_BASED_OUTPATIENT_CLINIC_OR_DEPARTMENT_OTHER): Payer: Self-pay | Admitting: *Deleted

## 2014-09-14 ENCOUNTER — Encounter (HOSPITAL_BASED_OUTPATIENT_CLINIC_OR_DEPARTMENT_OTHER)
Admission: RE | Disposition: A | Discharge status: Home / Self Care | Payer: Self-pay | Source: Ambulatory Visit | Attending: General Surgery

## 2014-09-14 ENCOUNTER — Encounter (HOSPITAL_BASED_OUTPATIENT_CLINIC_OR_DEPARTMENT_OTHER): Payer: Medicare Other | Admitting: Anesthesiology

## 2014-09-14 ENCOUNTER — Ambulatory Visit (HOSPITAL_BASED_OUTPATIENT_CLINIC_OR_DEPARTMENT_OTHER)
Admission: RE | Admit: 2014-09-14 | Discharge: 2014-09-14 | Disposition: A | Discharge status: Home / Self Care | Payer: Medicare Other | Source: Ambulatory Visit | Attending: General Surgery | Admitting: General Surgery

## 2014-09-14 DIAGNOSIS — E785 Hyperlipidemia, unspecified: Secondary | ICD-10-CM | POA: Insufficient documentation

## 2014-09-14 DIAGNOSIS — K4090 Unilateral inguinal hernia, without obstruction or gangrene, not specified as recurrent: Secondary | ICD-10-CM

## 2014-09-14 DIAGNOSIS — R1084 Generalized abdominal pain: Secondary | ICD-10-CM | POA: Diagnosis not present

## 2014-09-14 DIAGNOSIS — Z8673 Personal history of transient ischemic attack (TIA), and cerebral infarction without residual deficits: Secondary | ICD-10-CM | POA: Diagnosis not present

## 2014-09-14 DIAGNOSIS — I10 Essential (primary) hypertension: Secondary | ICD-10-CM | POA: Insufficient documentation

## 2014-09-14 DIAGNOSIS — M1990 Unspecified osteoarthritis, unspecified site: Secondary | ICD-10-CM | POA: Insufficient documentation

## 2014-09-14 DIAGNOSIS — G8918 Other acute postprocedural pain: Secondary | ICD-10-CM | POA: Diagnosis not present

## 2014-09-14 DIAGNOSIS — E669 Obesity, unspecified: Secondary | ICD-10-CM | POA: Insufficient documentation

## 2014-09-14 DIAGNOSIS — Z6828 Body mass index (BMI) 28.0-28.9, adult: Secondary | ICD-10-CM | POA: Insufficient documentation

## 2014-09-14 DIAGNOSIS — M1000 Idiopathic gout, unspecified site: Secondary | ICD-10-CM | POA: Insufficient documentation

## 2014-09-14 HISTORY — DX: Presence of dental prosthetic device (complete) (partial): Z97.2

## 2014-09-14 HISTORY — PX: INSERTION OF MESH: SHX5868

## 2014-09-14 HISTORY — DX: Presence of spectacles and contact lenses: Z97.3

## 2014-09-14 HISTORY — DX: Shortness of breath: R06.02

## 2014-09-14 HISTORY — DX: Spotted fever due to Rickettsia rickettsii: A77.0

## 2014-09-14 HISTORY — PX: INGUINAL HERNIA REPAIR: SHX194

## 2014-09-14 LAB — POCT HEMOGLOBIN-HEMACUE: HEMOGLOBIN: 12.9 g/dL — AB (ref 13.0–17.0)

## 2014-09-14 LAB — GLUCOSE, CAPILLARY: GLUCOSE-CAPILLARY: 117 mg/dL — AB (ref 70–99)

## 2014-09-14 SURGERY — REPAIR, HERNIA, INGUINAL, ADULT
Anesthesia: Regional | Site: Groin | Laterality: Right

## 2014-09-14 MED ORDER — FENTANYL CITRATE 0.05 MG/ML IJ SOLN: 25.0000 ug | INTRAMUSCULAR | Status: DC | PRN

## 2014-09-14 MED ORDER — BUPIVACAINE HCL (PF) 0.25 % IJ SOLN
INTRAMUSCULAR | Status: AC
  Filled 2014-09-14: qty 30

## 2014-09-14 MED ORDER — BUPIVACAINE-EPINEPHRINE (PF) 0.5% -1:200000 IJ SOLN
INTRAMUSCULAR | Status: DC | PRN
  Administered 2014-09-14: 30 mL via PERINEURAL

## 2014-09-14 MED ORDER — FENTANYL CITRATE 0.05 MG/ML IJ SOLN
INTRAMUSCULAR | Status: AC
  Filled 2014-09-14: qty 4

## 2014-09-14 MED ORDER — BUPIVACAINE-EPINEPHRINE (PF) 0.5% -1:200000 IJ SOLN
INTRAMUSCULAR | Status: AC
  Filled 2014-09-14: qty 30

## 2014-09-14 MED ORDER — OXYCODONE HCL 5 MG PO TABS
ORAL_TABLET | ORAL | Status: AC
  Filled 2014-09-14: qty 1

## 2014-09-14 MED ORDER — BUPIVACAINE HCL (PF) 0.5 % IJ SOLN
INTRAMUSCULAR | Status: AC
  Filled 2014-09-14: qty 30

## 2014-09-14 MED ORDER — MIDAZOLAM HCL 2 MG/2ML IJ SOLN
INTRAMUSCULAR | Status: AC
  Filled 2014-09-14: qty 2

## 2014-09-14 MED ORDER — OXYCODONE HCL 5 MG PO TABS
5.0000 mg | ORAL_TABLET | Freq: Once | ORAL | Status: AC | PRN
  Administered 2014-09-14: 5 mg via ORAL

## 2014-09-14 MED ORDER — MIDAZOLAM HCL 5 MG/5ML IJ SOLN
INTRAMUSCULAR | Status: DC | PRN
  Administered 2014-09-14 (×2): 1 mg via INTRAVENOUS

## 2014-09-14 MED ORDER — CEFAZOLIN SODIUM-DEXTROSE 2-3 GM-% IV SOLR
INTRAVENOUS | Status: AC
  Filled 2014-09-14: qty 50

## 2014-09-14 MED ORDER — EPHEDRINE SULFATE 50 MG/ML IJ SOLN
INTRAMUSCULAR | Status: DC | PRN
  Administered 2014-09-14: 10 mg via INTRAVENOUS

## 2014-09-14 MED ORDER — FENTANYL CITRATE 0.05 MG/ML IJ SOLN
INTRAMUSCULAR | Status: DC | PRN
  Administered 2014-09-14: 50 ug via INTRAVENOUS
  Administered 2014-09-14: 100 ug via INTRAVENOUS
  Administered 2014-09-14: 50 ug via INTRAVENOUS

## 2014-09-14 MED ORDER — LACTATED RINGERS IV SOLN
INTRAVENOUS | Status: DC
  Administered 2014-09-14 (×3): via INTRAVENOUS

## 2014-09-14 MED ORDER — CEFAZOLIN SODIUM-DEXTROSE 2-3 GM-% IV SOLR
INTRAVENOUS | Status: DC | PRN
  Administered 2014-09-14: 2 g via INTRAVENOUS

## 2014-09-14 MED ORDER — OXYCODONE HCL 5 MG/5ML PO SOLN: 5.0000 mg | Freq: Once | ORAL | Status: AC | PRN

## 2014-09-14 MED ORDER — FENTANYL CITRATE 0.05 MG/ML IJ SOLN
INTRAMUSCULAR | Status: AC
  Filled 2014-09-14: qty 2

## 2014-09-14 MED ORDER — GLYCOPYRROLATE 0.2 MG/ML IJ SOLN
INTRAMUSCULAR | Status: DC | PRN
  Administered 2014-09-14: 0.2 mg via INTRAVENOUS

## 2014-09-14 MED ORDER — OXYCODONE HCL 5 MG PO TABS: 5.0000 mg | ORAL_TABLET | ORAL | Status: DC | PRN

## 2014-09-14 MED ORDER — HYDROMORPHONE HCL 1 MG/ML IJ SOLN
0.2500 mg | INTRAMUSCULAR | Status: DC | PRN
  Administered 2014-09-14 (×2): 0.5 mg via INTRAVENOUS

## 2014-09-14 MED ORDER — FENTANYL CITRATE 0.05 MG/ML IJ SOLN
50.0000 ug | INTRAMUSCULAR | Status: DC | PRN
  Administered 2014-09-14: 100 ug via INTRAVENOUS

## 2014-09-14 MED ORDER — BUPIVACAINE-EPINEPHRINE (PF) 0.25% -1:200000 IJ SOLN
INTRAMUSCULAR | Status: AC
  Filled 2014-09-14: qty 30

## 2014-09-14 MED ORDER — SODIUM CHLORIDE 0.9 % IJ SOLN
3.0000 mL | Freq: Two times a day (BID) | INTRAMUSCULAR | Status: DC
Start: 2014-09-14 — End: 2014-09-14

## 2014-09-14 MED ORDER — ACETAMINOPHEN 650 MG RE SUPP: 650.0000 mg | RECTAL | Status: DC | PRN

## 2014-09-14 MED ORDER — OXYCODONE-ACETAMINOPHEN 7.5-325 MG PO TABS: 1.0000 | ORAL_TABLET | ORAL | Status: DC | PRN

## 2014-09-14 MED ORDER — SODIUM CHLORIDE 0.9 % IJ SOLN
3.0000 mL | INTRAMUSCULAR | Status: DC | PRN
Start: 2014-09-14 — End: 2014-09-14

## 2014-09-14 MED ORDER — LIDOCAINE HCL (CARDIAC) 20 MG/ML IV SOLN
INTRAVENOUS | Status: DC | PRN
  Administered 2014-09-14: 70 mg via INTRAVENOUS

## 2014-09-14 MED ORDER — ONDANSETRON HCL 4 MG/2ML IJ SOLN
INTRAMUSCULAR | Status: DC | PRN
  Administered 2014-09-14: 4 mg via INTRAVENOUS

## 2014-09-14 MED ORDER — BUPIVACAINE-EPINEPHRINE 0.5% -1:200000 IJ SOLN
INTRAMUSCULAR | Status: DC | PRN
  Administered 2014-09-14: 5 mL

## 2014-09-14 MED ORDER — PROPOFOL 10 MG/ML IV BOLUS
INTRAVENOUS | Status: DC | PRN
  Administered 2014-09-14: 50 mg via INTRAVENOUS

## 2014-09-14 MED ORDER — HYDROMORPHONE HCL 1 MG/ML IJ SOLN
INTRAMUSCULAR | Status: AC
  Filled 2014-09-14: qty 1

## 2014-09-14 MED ORDER — SODIUM CHLORIDE 0.9 % IV SOLN: INTRAVENOUS | Status: DC

## 2014-09-14 MED ORDER — ACETAMINOPHEN 325 MG PO TABS
650.0000 mg | ORAL_TABLET | ORAL | Status: DC | PRN
Start: 2014-09-14 — End: 2014-09-14

## 2014-09-14 MED ORDER — PROPOFOL 10 MG/ML IV BOLUS
INTRAVENOUS | Status: AC
  Filled 2014-09-14: qty 20

## 2014-09-14 MED ORDER — ONDANSETRON HCL 4 MG/2ML IJ SOLN: 4.0000 mg | Freq: Four times a day (QID) | INTRAMUSCULAR | Status: DC | PRN

## 2014-09-14 MED ORDER — SODIUM CHLORIDE 0.9 % IV SOLN: 250.0000 mL | INTRAVENOUS | Status: DC | PRN

## 2014-09-14 MED ORDER — MIDAZOLAM HCL 2 MG/2ML IJ SOLN
1.0000 mg | INTRAMUSCULAR | Status: DC | PRN
  Administered 2014-09-14: 2 mg via INTRAVENOUS

## 2014-09-14 SURGICAL SUPPLY — 58 items
BENZOIN TINCTURE PRP APPL 2/3 (GAUZE/BANDAGES/DRESSINGS) IMPLANT
BLADE CLIPPER SURG (BLADE) ×4 IMPLANT
BLADE HEX COATED 2.75 (ELECTRODE) ×4 IMPLANT
BLADE SURG 10 STRL SS (BLADE) ×4 IMPLANT
CANISTER SUCT 1200ML W/VALVE (MISCELLANEOUS) ×4 IMPLANT
CHLORAPREP W/TINT 26ML (MISCELLANEOUS) IMPLANT
CLOSURE WOUND 1/2 X4 (GAUZE/BANDAGES/DRESSINGS)
COVER BACK TABLE 60X90IN (DRAPES) ×4 IMPLANT
COVER MAYO STAND STRL (DRAPES) ×4 IMPLANT
DECANTER SPIKE VIAL GLASS SM (MISCELLANEOUS) IMPLANT
DERMABOND ADVANCED (GAUZE/BANDAGES/DRESSINGS)
DERMABOND ADVANCED .7 DNX12 (GAUZE/BANDAGES/DRESSINGS) IMPLANT
DRAIN PENROSE 1/2X12 LTX STRL (WOUND CARE) ×4 IMPLANT
DRAPE LAPAROTOMY TRNSV 102X78 (DRAPE) IMPLANT
DRAPE PED LAPAROTOMY (DRAPES) ×4 IMPLANT
DRAPE UTILITY XL STRL (DRAPES) IMPLANT
ELECT REM PT RETURN 9FT ADLT (ELECTROSURGICAL) ×4
ELECTRODE REM PT RTRN 9FT ADLT (ELECTROSURGICAL) ×2 IMPLANT
GLOVE EUDERMIC 7 POWDERFREE (GLOVE) ×4 IMPLANT
GOWN STRL REUS W/ TWL LRG LVL3 (GOWN DISPOSABLE) IMPLANT
GOWN STRL REUS W/ TWL XL LVL3 (GOWN DISPOSABLE) ×4 IMPLANT
GOWN STRL REUS W/TWL LRG LVL3 (GOWN DISPOSABLE)
GOWN STRL REUS W/TWL XL LVL3 (GOWN DISPOSABLE) ×4
MESH ULTRAPRO 3X6 7.6X15CM (Mesh General) ×4 IMPLANT
NEEDLE HYPO 22GX1.5 SAFETY (NEEDLE) ×4 IMPLANT
NEEDLE HYPO 25X1 1.5 SAFETY (NEEDLE) ×4 IMPLANT
NS IRRIG 1000ML POUR BTL (IV SOLUTION) ×4 IMPLANT
PACK BASIN DAY SURGERY FS (CUSTOM PROCEDURE TRAY) ×4 IMPLANT
PENCIL BUTTON HOLSTER BLD 10FT (ELECTRODE) ×4 IMPLANT
SLEEVE SCD COMPRESS KNEE MED (MISCELLANEOUS) ×4 IMPLANT
SPONGE GAUZE 4X4 12PLY STER LF (GAUZE/BANDAGES/DRESSINGS) IMPLANT
SPONGE LAP 4X18 X RAY DECT (DISPOSABLE) IMPLANT
STAPLER VISISTAT 35W (STAPLE) IMPLANT
STRIP CLOSURE SKIN 1/2X4 (GAUZE/BANDAGES/DRESSINGS) IMPLANT
SUT MNCRL AB 4-0 PS2 18 (SUTURE) ×4 IMPLANT
SUT PROLENE 1 CT (SUTURE) IMPLANT
SUT PROLENE 2 0 CT2 30 (SUTURE) ×8 IMPLANT
SUT SILK 2 0 SH (SUTURE) IMPLANT
SUT SILK 2 0 TIES 17X18 (SUTURE) ×2
SUT SILK 2-0 18XBRD TIE BLK (SUTURE) ×2 IMPLANT
SUT SILK 3 0 SH 30 (SUTURE) IMPLANT
SUT VIC AB 2-0 CT1 27 (SUTURE)
SUT VIC AB 2-0 CT1 TAPERPNT 27 (SUTURE) IMPLANT
SUT VIC AB 2-0 SH 27 (SUTURE) ×2
SUT VIC AB 2-0 SH 27XBRD (SUTURE) ×2 IMPLANT
SUT VIC AB 3-0 54X BRD REEL (SUTURE) IMPLANT
SUT VIC AB 3-0 BRD 54 (SUTURE)
SUT VIC AB 3-0 FS2 27 (SUTURE) IMPLANT
SUT VIC AB 3-0 SH 27 (SUTURE) ×2
SUT VIC AB 3-0 SH 27X BRD (SUTURE) ×2 IMPLANT
SUT VICRYL 3-0 CR8 SH (SUTURE) IMPLANT
SYR 20CC LL (SYRINGE) IMPLANT
SYRINGE 10CC LL (SYRINGE) ×4 IMPLANT
TOWEL OR NON WOVEN STRL DISP B (DISPOSABLE) ×8 IMPLANT
TRAY DSU PREP LF (CUSTOM PROCEDURE TRAY) ×4 IMPLANT
TUBE CONNECTING 20'X1/4 (TUBING) ×1
TUBE CONNECTING 20X1/4 (TUBING) ×3 IMPLANT
YANKAUER SUCT BULB TIP NO VENT (SUCTIONS) ×4 IMPLANT

## 2014-09-14 NOTE — Anesthesia Procedure Notes (Addendum)
Anesthesia Regional Block:  TAP block  Pre-Anesthetic Checklist: ,, timeout performed, Correct Patient, Correct Site, Correct Laterality, Correct Procedure, Correct Position, site marked, Risks and benefits discussed,  Surgical consent,  Pre-op evaluation,  At surgeon's request and post-op pain management  Laterality: Right  Prep: chloraprep       Needles:  Injection technique: Single-shot  Needle Type: Echogenic Needle     Needle Length: 9cm 9 cm Needle Gauge: 21 and 21 G    Additional Needles: TAP block Narrative:  Start time: 09/14/2014 11:13 AM End time: 09/14/2014 11:24 AM Injection made incrementally with aspirations every 5 mL.  Performed by: Personally  Anesthesiologist: Dr Marcie Bal  Additional Notes: Pt tolerated the procedure well.   Procedure Name: LMA Insertion Date/Time: 09/14/2014 12:19 PM Performed by: Maryella Shivers Pre-anesthesia Checklist: Patient identified, Emergency Drugs available, Suction available and Patient being monitored Patient Re-evaluated:Patient Re-evaluated prior to inductionOxygen Delivery Method: Circle System Utilized Preoxygenation: Pre-oxygenation with 100% oxygen Intubation Type: IV induction Ventilation: Mask ventilation without difficulty LMA: LMA inserted LMA Size: 5.0 Number of attempts: 1 Airway Equipment and Method: bite block Placement Confirmation: positive ETCO2 Tube secured with: Tape Dental Injury: Teeth and Oropharynx as per pre-operative assessment

## 2014-09-14 NOTE — Progress Notes (Signed)
AssistedDr. Hodierne with right, ultrasound guided, transabdominal plane block. Side rails up, monitors on throughout procedure. See vital signs in flow sheet. Tolerated Procedure well. ° °

## 2014-09-14 NOTE — Discharge Instructions (Signed)
[facility] _______[facility] ° °UMBILICAL OR INGUINAL HERNIA REPAIR: POST OP INSTRUCTIONS ° °Always review your discharge instruction sheet given to you by the facility where your surgery was performed. °IF YOU HAVE DISABILITY OR FAMILY LEAVE FORMS, YOU MUST BRING THEM TO THE OFFICE FOR PROCESSING.   °DO NOT GIVE THEM TO YOUR DOCTOR. ° °1. A  prescription for pain medication may be given to you upon discharge.  Take your pain medication as prescribed, if needed.  If narcotic pain medicine is not needed, then you may take acetaminophen (Tylenol) or ibuprofen (Advil) as needed. °2. Take your usually prescribed medications unless otherwise directed. °3. If you need a refill on your pain medication, please contact your pharmacy.  They will contact our office to request authorization. Prescriptions will not be filled after 5 pm or on week-ends. °4. You should follow a light diet the first 24 hours after arrival home, such as soup and crackers, etc.  Be sure to include lots of fluids daily.  Resume your normal diet the day after surgery. °5. Most patients will experience some swelling and bruising around the umbilicus or in the groin and scrotum.  Ice packs and reclining will help.  Swelling and bruising can take several days to resolve.  °6. It is common to experience some constipation if taking pain medication after surgery.  Increasing fluid intake and taking a stool softener (such as Colace) will usually help or prevent this problem from occurring.  A mild laxative (Milk of Magnesia or Miralax) should be taken according to package directions if there are no bowel movements after 48 hours. °7. Unless discharge instructions indicate otherwise, you may remove your bandages 24-48 hours after surgery, and you may shower at that time.  You may have steri-strips (small skin tapes) in place directly over the incision.  These strips should be left on the skin for 7-10 days.  If your surgeon used skin glue on the  incision, you may shower in 24 hours.  The glue will flake off over the next 2-3 weeks.  Any sutures or staples will be removed at the office during your follow-up visit. °8. ACTIVITIES:  You may resume regular (light) daily activities beginning the next day--such as daily self-care, walking, climbing stairs--gradually increasing activities as tolerated.  You may have sexual intercourse when it is comfortable.  Refrain from any heavy lifting or straining until approved by your doctor. °a. You may drive when you are no longer taking prescription pain medication, you can comfortably wear a seatbelt, and you can safely maneuver your car and apply brakes. °b. RETURN TO WORK:  __________________________________________________________ °9. You should see your doctor in the office for a follow-up appointment approximately 2-3 weeks after your surgery.  Make sure that you call for this appointment within a day or two after you arrive home to insure a convenient appointment time. °10. OTHER INSTRUCTIONS:  __________________________________________________________________________________________________________________________________________________________________________________________  °WHEN TO CALL YOUR DOCTOR: °1. Fever over 101.0 °2. Inability to urinate °3. Nausea and/or vomiting °4. Extreme swelling or bruising °5. Continued bleeding from incision. °6. Increased pain, redness, or drainage from the incision ° °The clinic staff is available to answer your questions during regular business hours.  Please don’t hesitate to call and ask to speak to one of the nurses for clinical concerns.  If you have a medical emergency, go to the nearest emergency room or call 911.  A surgeon from [facility] is always on call at the hospital ° ° °  [address], [address], [place], [place]  [address] ? ° [address] [address], , [place]   [address] °[phone] ? [phone] ? FAX [phone] °Web site:  [email or web address] ° ° ° °Post Anesthesia Home Care Instructions ° °Activity: °Get plenty of rest for the remainder of the day. A responsible adult should stay with you for 24 hours following the procedure.  °For the next 24 hours, DO NOT: °-Drive a car °-Operate machinery °-Drink alcoholic beverages °-Take any medication unless instructed by your physician °-Make any legal decisions or sign important papers. ° °Meals: °Start with liquid foods such as gelatin or soup. Progress to regular foods as tolerated. Avoid greasy, spicy, heavy foods. If nausea and/or vomiting occur, drink only clear liquids until the nausea and/or vomiting subsides. Call your physician if vomiting continues. ° °Special Instructions/Symptoms: °Your throat may feel dry or sore from the anesthesia or the breathing tube placed in your throat during surgery. If this causes discomfort, gargle with warm salt water. The discomfort should disappear within 24 hours. ° ° °Call your surgeon if you experience:  ° °1.  Fever over 101.0. °2.  Inability to urinate. °3.  Nausea and/or vomiting. °4.  Extreme swelling or bruising at the surgical site. °5.  Continued bleeding from the incision. °6.  Increased pain, redness or drainage from the incision. °7.  Problems related to your pain medication. °8. Any change in color, movement and/or sensation °9. Any problems and/or concerns ° °

## 2014-09-14 NOTE — Interval H&P Note (Signed)
History and Physical Interval Note:  09/14/2014 11:39 AM  Luis Kim  has presented today for surgery, with the diagnosis of right inguinal hernia  The various methods of treatment have been discussed with the patient and family. After consideration of risks, benefits and other options for treatment, the patient has consented to  Procedure(s): OPEN REPAIR RIGHT INGUINAL HERNIA (Right) INSERTION OF MESH (N/A) as a surgical intervention .  The patient's history has been reviewed, patient examined today,   no change in status, stable for surgery.  I have reviewed the patient's chart and labs.  Questions were answered to the patient's satisfaction.     [name]

## 2014-09-14 NOTE — Op Note (Signed)
Patient Name:           Luis Kim   Date of Surgery:        09/14/2014  Pre op Diagnosis:      Right inguinal hernia  Post op Diagnosis:    Right inguinal hernia  Procedure:                 Open repair of right inguinal hernia with mesh  Surgeon:                     Edsel Petrin. Dalbert Batman, M.D., FACS  Assistant:                      OR staff  Operative Indications:   The patient is a 68 year old male who presents with an inguinal hernia.  The patient returns to discuss surgical repair of his right inguinal hernia. I have discussed hernia repair with him in the past..  Originally he was evaluated on 10/07/2013 upon referral by Dr. Lisbeth Ply of [facility] in [place]. He has a 3 year history of right groin bulge. Has been getting larger and more painful. Sometimes he gets as large as a baseball but is usually reducible supine.  Comorbidities include history of TIA without residual, hypertension, hyperlipidemia, osteoarthritis, gout, obesity.  He is retired [employer] conservation officer.  I think this should be repaired as an open repair because of its size. He will be scheduled for open repair of right renal hernia with mesh.    Operative Findings:       The patient had a moderately large sliding indirect right inguinal hernia. The floor of the inguinal canal medially was very thin but was not bulging  Procedure in Detail:          Following the induction of general LMA anesthesia the patient's abdomen groin and genitalia were prepped and draped in a sterile fashion. A TAPP block had been performed by the anesthesia department preop.surgical timeout was performed. Intravenous antibiotics were given. 0.5% Marcaine with epinephrine was infiltrated into the skin and subcutaneous needs tissue.    A transverse incision was made in the right groin overlying the right inguinal canal. Dissection was carried down through the subcutaneous  tissue exposing the aponeurosis of the external oblique. The  external oblique was incised in the direction of its fibers, opening up the external inguinal ring. The external oblique was dissected away from the underlying tissues and a self-retaining retractor placed. The cord structures were mobilized and encircled with a Penrose drain. The ilioinguinal nerve was isolated and sacrificed. It was traced back to its emergence from the muscles laterally, clamped divided and ligated with a 2-0 silk tie. The redundant nerve medially  was excised. Cremasteric muscle fibers were skeletonized off of the cord structures. Moderately large indirect sliding hernia was identified. I opened the hernia sac to inspect it. The sac was closed with a pursestring suture of 2-0 Vicryl. The redundant sac was excised. The  hernia was then reduced. The internal anal ring was tightened with several interrupted sutures of 2-0 Vicryl. The floor of the inguinal canal was repaired and reinforced with an onlay graft of  ultra Pro mesh. A 3" x 6" piece of mesh was brought to the operative field. This mesh was secured with running sutures of 2-0 Prolene and  interrupted mattress sutures of 2-0 Prolene. The mesh was sutured  along the inguinal ligament inferiorly. The mesh was s  sutured so as to generously overlap the fascia at the pubic tubercle. Medially, superiorly, and superior laterally several interrupted Prolene sutures were placed. The mesh was incised laterally so as to wrap around the cord structures at the internal ring. The tails of the mesh were overlapped laterally and a few other sutures were placed laterally. This provided very secure coverage, repair and fixation both medial and lateral to the internal ring but allowed an adequate fingertip opening for the cord structures. It was irrigated with saline. Hemostasis was excellent. The external oblique  was closed with a running suture of 2-0 Vicryl  Scarpa's fascia was closed with 3-0 Vicryl sutures and the skin closed with a running 4-0  Monocryl subcuticular suture and Dermabond. The patient tolerated the procedure well was taken to PACU in stable condition. EBL 10 cc. Counts correct. Complications none.     Edsel Petrin. Dalbert Batman, M.D., FACS General and Minimally Invasive Surgery Breast and Colorectal Surgery  09/14/2014 1:20 PM

## 2014-09-14 NOTE — Anesthesia Postprocedure Evaluation (Signed)
Anesthesia Post Note  Patient: Luis Kim  Procedure(s) Performed: Procedure(s) (LRB): OPEN REPAIR RIGHT INGUINAL HERNIA (Right) INSERTION OF MESH (Left)  Anesthesia type: General  Patient location: PACU  Post pain: Pain level controlled and Adequate analgesia  Post assessment: Post-op Vital signs reviewed, Patient's Cardiovascular Status Stable, Respiratory Function Stable, Patent Airway and Pain level controlled  Last Vitals:  Filed Vitals:   09/14/14 1445  BP:   Pulse: 77  Temp:   Resp: 12    Post vital signs: Reviewed and stable  Level of consciousness: awake, alert  and oriented  Complications: No apparent anesthesia complications

## 2014-09-14 NOTE — Transfer of Care (Signed)
Immediate Anesthesia Transfer of Care Note  Patient: Luis Kim  Procedure(s) Performed: Procedure(s): OPEN REPAIR RIGHT INGUINAL HERNIA (Right) INSERTION OF MESH (Left)  Patient Location: PACU  Anesthesia Type:GA combined with regional for post-op pain  Level of Consciousness: awake, alert  and oriented  Airway & Oxygen Therapy: Patient Spontanous Breathing and Patient connected to face mask oxygen  Post-op Assessment: Report given to PACU RN and Post -op Vital signs reviewed and stable  Post vital signs: Reviewed and stable  Complications: No apparent anesthesia complications

## 2014-09-14 NOTE — Anesthesia Preprocedure Evaluation (Signed)
Anesthesia Evaluation  Patient identified by MRN, date of birth, ID band Patient awake    Reviewed: Allergy & Precautions, H&P , NPO status , Patient's Chart, lab work & pertinent test results  Airway Mallampati: II  Neck ROM: full    Dental   Pulmonary shortness of breath,          Cardiovascular negative cardio ROS      Neuro/Psych TIA   GI/Hepatic   Endo/Other  diabetes, Type 2  Renal/GU      Musculoskeletal   Abdominal   Peds  Hematology   Anesthesia Other Findings   Reproductive/Obstetrics                           Anesthesia Physical Anesthesia Plan  ASA: II  Anesthesia Plan: General and Regional   Post-op Pain Management: MAC Combined w/ Regional for Post-op pain   Induction: Intravenous  Airway Management Planned: LMA  Additional Equipment:   Intra-op Plan:   Post-operative Plan:   Informed Consent: I have reviewed the patients History and Physical, chart, labs and discussed the procedure including the risks, benefits and alternatives for the proposed anesthesia with the patient or authorized representative who has indicated his/her understanding and acceptance.     Plan Discussed with: CRNA, Anesthesiologist and Surgeon  Anesthesia Plan Comments:         Anesthesia Quick Evaluation

## 2014-09-15 ENCOUNTER — Encounter (HOSPITAL_BASED_OUTPATIENT_CLINIC_OR_DEPARTMENT_OTHER): Payer: Self-pay | Admitting: General Surgery

## 2014-10-04 DIAGNOSIS — M2662 Arthralgia of temporomandibular joint: Secondary | ICD-10-CM | POA: Diagnosis not present

## 2014-10-04 DIAGNOSIS — K047 Periapical abscess without sinus: Secondary | ICD-10-CM | POA: Diagnosis not present

## 2014-10-25 DIAGNOSIS — R220 Localized swelling, mass and lump, head: Secondary | ICD-10-CM | POA: Diagnosis not present

## 2014-10-25 DIAGNOSIS — R6884 Jaw pain: Secondary | ICD-10-CM | POA: Diagnosis not present

## 2014-11-29 DIAGNOSIS — S8011XA Contusion of right lower leg, initial encounter: Secondary | ICD-10-CM | POA: Diagnosis not present

## 2014-11-29 DIAGNOSIS — Z23 Encounter for immunization: Secondary | ICD-10-CM | POA: Diagnosis not present

## 2014-11-29 DIAGNOSIS — S301XXA Contusion of abdominal wall, initial encounter: Secondary | ICD-10-CM | POA: Diagnosis not present

## 2014-11-29 DIAGNOSIS — S50812A Abrasion of left forearm, initial encounter: Secondary | ICD-10-CM | POA: Diagnosis not present

## 2014-12-02 DIAGNOSIS — S50812A Abrasion of left forearm, initial encounter: Secondary | ICD-10-CM | POA: Diagnosis not present

## 2015-02-06 DIAGNOSIS — E780 Pure hypercholesterolemia: Secondary | ICD-10-CM | POA: Diagnosis not present

## 2015-02-06 DIAGNOSIS — Z79899 Other long term (current) drug therapy: Secondary | ICD-10-CM | POA: Diagnosis not present

## 2015-02-06 DIAGNOSIS — M109 Gout, unspecified: Secondary | ICD-10-CM | POA: Diagnosis not present

## 2015-02-06 DIAGNOSIS — Z1389 Encounter for screening for other disorder: Secondary | ICD-10-CM | POA: Diagnosis not present

## 2015-02-06 DIAGNOSIS — E119 Type 2 diabetes mellitus without complications: Secondary | ICD-10-CM | POA: Diagnosis not present

## 2015-02-06 DIAGNOSIS — Z125 Encounter for screening for malignant neoplasm of prostate: Secondary | ICD-10-CM | POA: Diagnosis not present

## 2015-02-06 DIAGNOSIS — I10 Essential (primary) hypertension: Secondary | ICD-10-CM | POA: Diagnosis not present

## 2015-02-06 DIAGNOSIS — M179 Osteoarthritis of knee, unspecified: Secondary | ICD-10-CM | POA: Diagnosis not present

## 2015-02-06 DIAGNOSIS — G4700 Insomnia, unspecified: Secondary | ICD-10-CM | POA: Diagnosis not present

## 2015-03-24 DIAGNOSIS — K0521 Aggressive periodontitis, localized: Secondary | ICD-10-CM | POA: Diagnosis not present

## 2015-03-24 DIAGNOSIS — Z6828 Body mass index (BMI) 28.0-28.9, adult: Secondary | ICD-10-CM | POA: Diagnosis not present

## 2015-05-26 DIAGNOSIS — M179 Osteoarthritis of knee, unspecified: Secondary | ICD-10-CM | POA: Diagnosis not present

## 2015-08-10 DIAGNOSIS — M109 Gout, unspecified: Secondary | ICD-10-CM | POA: Diagnosis not present

## 2015-08-10 DIAGNOSIS — H8110 Benign paroxysmal vertigo, unspecified ear: Secondary | ICD-10-CM | POA: Diagnosis not present

## 2015-08-10 DIAGNOSIS — Z1389 Encounter for screening for other disorder: Secondary | ICD-10-CM | POA: Diagnosis not present

## 2015-08-10 DIAGNOSIS — Z9181 History of falling: Secondary | ICD-10-CM | POA: Diagnosis not present

## 2015-08-10 DIAGNOSIS — I10 Essential (primary) hypertension: Secondary | ICD-10-CM | POA: Diagnosis not present

## 2015-08-10 DIAGNOSIS — Z79899 Other long term (current) drug therapy: Secondary | ICD-10-CM | POA: Diagnosis not present

## 2015-08-10 DIAGNOSIS — Z6829 Body mass index (BMI) 29.0-29.9, adult: Secondary | ICD-10-CM | POA: Diagnosis not present

## 2015-08-10 DIAGNOSIS — E119 Type 2 diabetes mellitus without complications: Secondary | ICD-10-CM | POA: Diagnosis not present

## 2015-08-10 DIAGNOSIS — E780 Pure hypercholesterolemia: Secondary | ICD-10-CM | POA: Diagnosis not present

## 2016-02-13 DIAGNOSIS — M179 Osteoarthritis of knee, unspecified: Secondary | ICD-10-CM | POA: Diagnosis not present

## 2016-02-13 DIAGNOSIS — Z125 Encounter for screening for malignant neoplasm of prostate: Secondary | ICD-10-CM | POA: Diagnosis not present

## 2016-02-13 DIAGNOSIS — E663 Overweight: Secondary | ICD-10-CM | POA: Diagnosis not present

## 2016-02-13 DIAGNOSIS — M109 Gout, unspecified: Secondary | ICD-10-CM | POA: Diagnosis not present

## 2016-02-13 DIAGNOSIS — Z79899 Other long term (current) drug therapy: Secondary | ICD-10-CM | POA: Diagnosis not present

## 2016-02-13 DIAGNOSIS — Z6829 Body mass index (BMI) 29.0-29.9, adult: Secondary | ICD-10-CM | POA: Diagnosis not present

## 2016-02-13 DIAGNOSIS — I10 Essential (primary) hypertension: Secondary | ICD-10-CM | POA: Diagnosis not present

## 2016-02-13 DIAGNOSIS — E7800 Pure hypercholesterolemia, unspecified: Secondary | ICD-10-CM | POA: Diagnosis not present

## 2016-02-13 DIAGNOSIS — E119 Type 2 diabetes mellitus without complications: Secondary | ICD-10-CM | POA: Diagnosis not present

## 2016-02-20 DIAGNOSIS — M25511 Pain in right shoulder: Secondary | ICD-10-CM | POA: Diagnosis not present

## 2016-02-20 DIAGNOSIS — Z6828 Body mass index (BMI) 28.0-28.9, adult: Secondary | ICD-10-CM | POA: Diagnosis not present

## 2016-05-15 DIAGNOSIS — M1991 Primary osteoarthritis, unspecified site: Secondary | ICD-10-CM | POA: Diagnosis not present

## 2016-05-15 DIAGNOSIS — E119 Type 2 diabetes mellitus without complications: Secondary | ICD-10-CM | POA: Diagnosis not present

## 2016-05-15 DIAGNOSIS — Z79899 Other long term (current) drug therapy: Secondary | ICD-10-CM | POA: Diagnosis not present

## 2016-05-15 DIAGNOSIS — Z6828 Body mass index (BMI) 28.0-28.9, adult: Secondary | ICD-10-CM | POA: Diagnosis not present

## 2016-05-23 DIAGNOSIS — H524 Presbyopia: Secondary | ICD-10-CM | POA: Diagnosis not present

## 2016-05-23 DIAGNOSIS — H25813 Combined forms of age-related cataract, bilateral: Secondary | ICD-10-CM | POA: Diagnosis not present

## 2016-08-15 DIAGNOSIS — M109 Gout, unspecified: Secondary | ICD-10-CM | POA: Diagnosis not present

## 2016-08-15 DIAGNOSIS — Z9181 History of falling: Secondary | ICD-10-CM | POA: Diagnosis not present

## 2016-08-15 DIAGNOSIS — E119 Type 2 diabetes mellitus without complications: Secondary | ICD-10-CM | POA: Diagnosis not present

## 2016-08-15 DIAGNOSIS — Z6827 Body mass index (BMI) 27.0-27.9, adult: Secondary | ICD-10-CM | POA: Diagnosis not present

## 2016-08-15 DIAGNOSIS — I10 Essential (primary) hypertension: Secondary | ICD-10-CM | POA: Diagnosis not present

## 2016-08-15 DIAGNOSIS — Z79899 Other long term (current) drug therapy: Secondary | ICD-10-CM | POA: Diagnosis not present

## 2016-08-15 DIAGNOSIS — Z1389 Encounter for screening for other disorder: Secondary | ICD-10-CM | POA: Diagnosis not present

## 2016-08-15 DIAGNOSIS — E7800 Pure hypercholesterolemia, unspecified: Secondary | ICD-10-CM | POA: Diagnosis not present

## 2016-08-19 DIAGNOSIS — K05219 Aggressive periodontitis, localized, unspecified severity: Secondary | ICD-10-CM | POA: Diagnosis not present

## 2016-11-07 DIAGNOSIS — Z6828 Body mass index (BMI) 28.0-28.9, adult: Secondary | ICD-10-CM | POA: Diagnosis not present

## 2016-11-07 DIAGNOSIS — M25561 Pain in right knee: Secondary | ICD-10-CM | POA: Diagnosis not present

## 2016-11-07 DIAGNOSIS — E663 Overweight: Secondary | ICD-10-CM | POA: Diagnosis not present

## 2016-11-21 DIAGNOSIS — M179 Osteoarthritis of knee, unspecified: Secondary | ICD-10-CM | POA: Diagnosis not present

## 2017-03-06 DIAGNOSIS — E7800 Pure hypercholesterolemia, unspecified: Secondary | ICD-10-CM | POA: Diagnosis not present

## 2017-03-06 DIAGNOSIS — E119 Type 2 diabetes mellitus without complications: Secondary | ICD-10-CM | POA: Diagnosis not present

## 2017-03-06 DIAGNOSIS — Z125 Encounter for screening for malignant neoplasm of prostate: Secondary | ICD-10-CM | POA: Diagnosis not present

## 2017-03-06 DIAGNOSIS — M898X1 Other specified disorders of bone, shoulder: Secondary | ICD-10-CM | POA: Diagnosis not present

## 2017-03-06 DIAGNOSIS — R42 Dizziness and giddiness: Secondary | ICD-10-CM | POA: Diagnosis not present

## 2017-03-06 DIAGNOSIS — I10 Essential (primary) hypertension: Secondary | ICD-10-CM | POA: Diagnosis not present

## 2017-03-06 DIAGNOSIS — Z79899 Other long term (current) drug therapy: Secondary | ICD-10-CM | POA: Diagnosis not present

## 2017-04-28 DIAGNOSIS — Z6828 Body mass index (BMI) 28.0-28.9, adult: Secondary | ICD-10-CM | POA: Diagnosis not present

## 2017-04-28 DIAGNOSIS — R600 Localized edema: Secondary | ICD-10-CM | POA: Diagnosis not present

## 2017-04-28 DIAGNOSIS — M1991 Primary osteoarthritis, unspecified site: Secondary | ICD-10-CM | POA: Diagnosis not present

## 2017-06-12 DIAGNOSIS — I10 Essential (primary) hypertension: Secondary | ICD-10-CM | POA: Diagnosis not present

## 2017-06-12 DIAGNOSIS — M1991 Primary osteoarthritis, unspecified site: Secondary | ICD-10-CM | POA: Diagnosis not present

## 2017-06-12 DIAGNOSIS — R600 Localized edema: Secondary | ICD-10-CM | POA: Diagnosis not present

## 2017-06-12 DIAGNOSIS — F329 Major depressive disorder, single episode, unspecified: Secondary | ICD-10-CM | POA: Diagnosis not present

## 2017-06-12 DIAGNOSIS — F17200 Nicotine dependence, unspecified, uncomplicated: Secondary | ICD-10-CM | POA: Diagnosis not present

## 2017-06-12 DIAGNOSIS — Z6828 Body mass index (BMI) 28.0-28.9, adult: Secondary | ICD-10-CM | POA: Diagnosis not present

## 2017-06-12 DIAGNOSIS — E119 Type 2 diabetes mellitus without complications: Secondary | ICD-10-CM | POA: Diagnosis not present

## 2017-06-12 DIAGNOSIS — M109 Gout, unspecified: Secondary | ICD-10-CM | POA: Diagnosis not present

## 2017-06-20 DIAGNOSIS — Z0000 Encounter for general adult medical examination without abnormal findings: Secondary | ICD-10-CM | POA: Diagnosis not present

## 2017-06-20 DIAGNOSIS — Z1211 Encounter for screening for malignant neoplasm of colon: Secondary | ICD-10-CM | POA: Diagnosis not present

## 2017-06-20 DIAGNOSIS — Z1389 Encounter for screening for other disorder: Secondary | ICD-10-CM | POA: Diagnosis not present

## 2017-06-20 DIAGNOSIS — Z125 Encounter for screening for malignant neoplasm of prostate: Secondary | ICD-10-CM | POA: Diagnosis not present

## 2017-06-20 DIAGNOSIS — E785 Hyperlipidemia, unspecified: Secondary | ICD-10-CM | POA: Diagnosis not present

## 2017-06-20 DIAGNOSIS — Z9181 History of falling: Secondary | ICD-10-CM | POA: Diagnosis not present

## 2017-08-05 DIAGNOSIS — Z1212 Encounter for screening for malignant neoplasm of rectum: Secondary | ICD-10-CM | POA: Diagnosis not present

## 2017-08-05 DIAGNOSIS — Z1211 Encounter for screening for malignant neoplasm of colon: Secondary | ICD-10-CM | POA: Diagnosis not present

## 2017-09-26 DIAGNOSIS — D125 Benign neoplasm of sigmoid colon: Secondary | ICD-10-CM | POA: Diagnosis not present

## 2017-09-26 DIAGNOSIS — D124 Benign neoplasm of descending colon: Secondary | ICD-10-CM | POA: Diagnosis not present

## 2017-09-26 DIAGNOSIS — Z1211 Encounter for screening for malignant neoplasm of colon: Secondary | ICD-10-CM | POA: Diagnosis not present

## 2017-09-26 DIAGNOSIS — D122 Benign neoplasm of ascending colon: Secondary | ICD-10-CM | POA: Diagnosis not present

## 2017-09-26 DIAGNOSIS — D126 Benign neoplasm of colon, unspecified: Secondary | ICD-10-CM | POA: Diagnosis not present

## 2017-10-02 DIAGNOSIS — E119 Type 2 diabetes mellitus without complications: Secondary | ICD-10-CM | POA: Diagnosis not present

## 2017-10-02 DIAGNOSIS — M109 Gout, unspecified: Secondary | ICD-10-CM | POA: Diagnosis not present

## 2017-10-02 DIAGNOSIS — M1710 Unilateral primary osteoarthritis, unspecified knee: Secondary | ICD-10-CM | POA: Diagnosis not present

## 2017-10-02 DIAGNOSIS — E7800 Pure hypercholesterolemia, unspecified: Secondary | ICD-10-CM | POA: Diagnosis not present

## 2017-10-02 DIAGNOSIS — I10 Essential (primary) hypertension: Secondary | ICD-10-CM | POA: Diagnosis not present

## 2017-10-02 DIAGNOSIS — Z79899 Other long term (current) drug therapy: Secondary | ICD-10-CM | POA: Diagnosis not present

## 2017-10-02 DIAGNOSIS — Z6827 Body mass index (BMI) 27.0-27.9, adult: Secondary | ICD-10-CM | POA: Diagnosis not present

## 2018-01-02 DIAGNOSIS — Z9181 History of falling: Secondary | ICD-10-CM | POA: Diagnosis not present

## 2018-01-02 DIAGNOSIS — Z1331 Encounter for screening for depression: Secondary | ICD-10-CM | POA: Diagnosis not present

## 2018-01-02 DIAGNOSIS — F329 Major depressive disorder, single episode, unspecified: Secondary | ICD-10-CM | POA: Diagnosis not present

## 2018-01-02 DIAGNOSIS — M1710 Unilateral primary osteoarthritis, unspecified knee: Secondary | ICD-10-CM | POA: Diagnosis not present

## 2018-01-02 DIAGNOSIS — I10 Essential (primary) hypertension: Secondary | ICD-10-CM | POA: Diagnosis not present

## 2018-01-02 DIAGNOSIS — Z6827 Body mass index (BMI) 27.0-27.9, adult: Secondary | ICD-10-CM | POA: Diagnosis not present

## 2018-01-02 DIAGNOSIS — E119 Type 2 diabetes mellitus without complications: Secondary | ICD-10-CM | POA: Diagnosis not present

## 2018-01-02 DIAGNOSIS — M25512 Pain in left shoulder: Secondary | ICD-10-CM | POA: Diagnosis not present

## 2018-01-07 DIAGNOSIS — J069 Acute upper respiratory infection, unspecified: Secondary | ICD-10-CM | POA: Diagnosis not present

## 2018-01-07 DIAGNOSIS — Z6827 Body mass index (BMI) 27.0-27.9, adult: Secondary | ICD-10-CM | POA: Diagnosis not present

## 2018-01-07 DIAGNOSIS — H1030 Unspecified acute conjunctivitis, unspecified eye: Secondary | ICD-10-CM | POA: Diagnosis not present

## 2018-04-08 DIAGNOSIS — M1710 Unilateral primary osteoarthritis, unspecified knee: Secondary | ICD-10-CM | POA: Diagnosis not present

## 2018-04-08 DIAGNOSIS — E119 Type 2 diabetes mellitus without complications: Secondary | ICD-10-CM | POA: Diagnosis not present

## 2018-04-08 DIAGNOSIS — M109 Gout, unspecified: Secondary | ICD-10-CM | POA: Diagnosis not present

## 2018-04-08 DIAGNOSIS — Z79899 Other long term (current) drug therapy: Secondary | ICD-10-CM | POA: Diagnosis not present

## 2018-04-08 DIAGNOSIS — E7800 Pure hypercholesterolemia, unspecified: Secondary | ICD-10-CM | POA: Diagnosis not present

## 2018-04-08 DIAGNOSIS — I10 Essential (primary) hypertension: Secondary | ICD-10-CM | POA: Diagnosis not present

## 2018-04-08 DIAGNOSIS — Z125 Encounter for screening for malignant neoplasm of prostate: Secondary | ICD-10-CM | POA: Diagnosis not present

## 2018-04-29 DIAGNOSIS — M1710 Unilateral primary osteoarthritis, unspecified knee: Secondary | ICD-10-CM | POA: Diagnosis not present

## 2018-05-14 DIAGNOSIS — S41119A Laceration without foreign body of unspecified upper arm, initial encounter: Secondary | ICD-10-CM | POA: Diagnosis not present

## 2018-05-14 DIAGNOSIS — Z9181 History of falling: Secondary | ICD-10-CM | POA: Diagnosis not present

## 2018-05-14 DIAGNOSIS — Z1339 Encounter for screening examination for other mental health and behavioral disorders: Secondary | ICD-10-CM | POA: Diagnosis not present

## 2018-06-22 DIAGNOSIS — Z125 Encounter for screening for malignant neoplasm of prostate: Secondary | ICD-10-CM | POA: Diagnosis not present

## 2018-06-22 DIAGNOSIS — Z136 Encounter for screening for cardiovascular disorders: Secondary | ICD-10-CM | POA: Diagnosis not present

## 2018-06-22 DIAGNOSIS — Z9181 History of falling: Secondary | ICD-10-CM | POA: Diagnosis not present

## 2018-06-22 DIAGNOSIS — Z1331 Encounter for screening for depression: Secondary | ICD-10-CM | POA: Diagnosis not present

## 2018-06-22 DIAGNOSIS — Z0000 Encounter for general adult medical examination without abnormal findings: Secondary | ICD-10-CM | POA: Diagnosis not present

## 2018-06-22 DIAGNOSIS — Z139 Encounter for screening, unspecified: Secondary | ICD-10-CM | POA: Diagnosis not present

## 2018-06-22 DIAGNOSIS — E785 Hyperlipidemia, unspecified: Secondary | ICD-10-CM | POA: Diagnosis not present

## 2018-07-09 DIAGNOSIS — M1710 Unilateral primary osteoarthritis, unspecified knee: Secondary | ICD-10-CM | POA: Diagnosis not present

## 2018-07-09 DIAGNOSIS — F329 Major depressive disorder, single episode, unspecified: Secondary | ICD-10-CM | POA: Diagnosis not present

## 2018-07-09 DIAGNOSIS — I10 Essential (primary) hypertension: Secondary | ICD-10-CM | POA: Diagnosis not present

## 2018-07-09 DIAGNOSIS — R600 Localized edema: Secondary | ICD-10-CM | POA: Diagnosis not present

## 2018-10-08 DIAGNOSIS — M1710 Unilateral primary osteoarthritis, unspecified knee: Secondary | ICD-10-CM | POA: Diagnosis not present

## 2018-10-08 DIAGNOSIS — I10 Essential (primary) hypertension: Secondary | ICD-10-CM | POA: Diagnosis not present

## 2018-10-08 DIAGNOSIS — E119 Type 2 diabetes mellitus without complications: Secondary | ICD-10-CM | POA: Diagnosis not present

## 2018-10-08 DIAGNOSIS — M109 Gout, unspecified: Secondary | ICD-10-CM | POA: Diagnosis not present

## 2018-10-08 DIAGNOSIS — E7800 Pure hypercholesterolemia, unspecified: Secondary | ICD-10-CM | POA: Diagnosis not present

## 2018-10-08 DIAGNOSIS — Z79899 Other long term (current) drug therapy: Secondary | ICD-10-CM | POA: Diagnosis not present

## 2019-01-08 DIAGNOSIS — M170 Bilateral primary osteoarthritis of knee: Secondary | ICD-10-CM | POA: Diagnosis not present

## 2019-01-08 DIAGNOSIS — E119 Type 2 diabetes mellitus without complications: Secondary | ICD-10-CM | POA: Diagnosis not present

## 2019-01-08 DIAGNOSIS — I10 Essential (primary) hypertension: Secondary | ICD-10-CM | POA: Diagnosis not present

## 2019-01-08 DIAGNOSIS — Z87891 Personal history of nicotine dependence: Secondary | ICD-10-CM | POA: Diagnosis not present

## 2019-01-08 DIAGNOSIS — Z1331 Encounter for screening for depression: Secondary | ICD-10-CM | POA: Diagnosis not present

## 2019-04-12 DIAGNOSIS — Z125 Encounter for screening for malignant neoplasm of prostate: Secondary | ICD-10-CM | POA: Diagnosis not present

## 2019-04-12 DIAGNOSIS — E119 Type 2 diabetes mellitus without complications: Secondary | ICD-10-CM | POA: Diagnosis not present

## 2019-04-12 DIAGNOSIS — M109 Gout, unspecified: Secondary | ICD-10-CM | POA: Diagnosis not present

## 2019-04-12 DIAGNOSIS — M1710 Unilateral primary osteoarthritis, unspecified knee: Secondary | ICD-10-CM | POA: Diagnosis not present

## 2019-04-12 DIAGNOSIS — I10 Essential (primary) hypertension: Secondary | ICD-10-CM | POA: Diagnosis not present

## 2019-04-12 DIAGNOSIS — Z79899 Other long term (current) drug therapy: Secondary | ICD-10-CM | POA: Diagnosis not present

## 2019-04-12 DIAGNOSIS — E7800 Pure hypercholesterolemia, unspecified: Secondary | ICD-10-CM | POA: Diagnosis not present

## 2019-07-26 DIAGNOSIS — Z6825 Body mass index (BMI) 25.0-25.9, adult: Secondary | ICD-10-CM | POA: Diagnosis not present

## 2019-07-26 DIAGNOSIS — K05219 Aggressive periodontitis, localized, unspecified severity: Secondary | ICD-10-CM | POA: Diagnosis not present

## 2019-08-05 DIAGNOSIS — Z125 Encounter for screening for malignant neoplasm of prostate: Secondary | ICD-10-CM | POA: Diagnosis not present

## 2019-08-05 DIAGNOSIS — Z1331 Encounter for screening for depression: Secondary | ICD-10-CM | POA: Diagnosis not present

## 2019-08-05 DIAGNOSIS — E785 Hyperlipidemia, unspecified: Secondary | ICD-10-CM | POA: Diagnosis not present

## 2019-08-05 DIAGNOSIS — Z0000 Encounter for general adult medical examination without abnormal findings: Secondary | ICD-10-CM | POA: Diagnosis not present

## 2019-08-05 DIAGNOSIS — Z139 Encounter for screening, unspecified: Secondary | ICD-10-CM | POA: Diagnosis not present

## 2019-08-05 DIAGNOSIS — Z9181 History of falling: Secondary | ICD-10-CM | POA: Diagnosis not present

## 2019-10-06 DIAGNOSIS — E119 Type 2 diabetes mellitus without complications: Secondary | ICD-10-CM | POA: Diagnosis not present

## 2019-10-06 DIAGNOSIS — I10 Essential (primary) hypertension: Secondary | ICD-10-CM | POA: Diagnosis not present

## 2019-10-06 DIAGNOSIS — M1710 Unilateral primary osteoarthritis, unspecified knee: Secondary | ICD-10-CM | POA: Diagnosis not present

## 2019-10-06 DIAGNOSIS — Z79899 Other long term (current) drug therapy: Secondary | ICD-10-CM | POA: Diagnosis not present

## 2019-10-06 DIAGNOSIS — M109 Gout, unspecified: Secondary | ICD-10-CM | POA: Diagnosis not present

## 2019-10-06 DIAGNOSIS — E7800 Pure hypercholesterolemia, unspecified: Secondary | ICD-10-CM | POA: Diagnosis not present

## 2020-01-06 DIAGNOSIS — M1710 Unilateral primary osteoarthritis, unspecified knee: Secondary | ICD-10-CM | POA: Diagnosis not present

## 2020-01-06 DIAGNOSIS — Z6825 Body mass index (BMI) 25.0-25.9, adult: Secondary | ICD-10-CM | POA: Diagnosis not present

## 2020-01-06 DIAGNOSIS — E119 Type 2 diabetes mellitus without complications: Secondary | ICD-10-CM | POA: Diagnosis not present

## 2020-01-06 DIAGNOSIS — I10 Essential (primary) hypertension: Secondary | ICD-10-CM | POA: Diagnosis not present

## 2020-02-28 DIAGNOSIS — H52223 Regular astigmatism, bilateral: Secondary | ICD-10-CM | POA: Diagnosis not present

## 2020-02-28 DIAGNOSIS — H5203 Hypermetropia, bilateral: Secondary | ICD-10-CM | POA: Diagnosis not present

## 2020-02-28 DIAGNOSIS — H524 Presbyopia: Secondary | ICD-10-CM | POA: Diagnosis not present

## 2020-02-28 DIAGNOSIS — H2513 Age-related nuclear cataract, bilateral: Secondary | ICD-10-CM | POA: Diagnosis not present

## 2020-04-05 DIAGNOSIS — I10 Essential (primary) hypertension: Secondary | ICD-10-CM | POA: Diagnosis not present

## 2020-04-05 DIAGNOSIS — Z79899 Other long term (current) drug therapy: Secondary | ICD-10-CM | POA: Diagnosis not present

## 2020-04-05 DIAGNOSIS — M1710 Unilateral primary osteoarthritis, unspecified knee: Secondary | ICD-10-CM | POA: Diagnosis not present

## 2020-04-05 DIAGNOSIS — E7800 Pure hypercholesterolemia, unspecified: Secondary | ICD-10-CM | POA: Diagnosis not present

## 2020-04-05 DIAGNOSIS — Z125 Encounter for screening for malignant neoplasm of prostate: Secondary | ICD-10-CM | POA: Diagnosis not present

## 2020-04-05 DIAGNOSIS — E119 Type 2 diabetes mellitus without complications: Secondary | ICD-10-CM | POA: Diagnosis not present

## 2020-04-05 DIAGNOSIS — M109 Gout, unspecified: Secondary | ICD-10-CM | POA: Diagnosis not present

## 2020-05-05 DIAGNOSIS — Z125 Encounter for screening for malignant neoplasm of prostate: Secondary | ICD-10-CM | POA: Diagnosis not present

## 2020-05-05 DIAGNOSIS — Z122 Encounter for screening for malignant neoplasm of respiratory organs: Secondary | ICD-10-CM | POA: Diagnosis not present

## 2020-07-12 DIAGNOSIS — F329 Major depressive disorder, single episode, unspecified: Secondary | ICD-10-CM | POA: Diagnosis not present

## 2020-07-12 DIAGNOSIS — R600 Localized edema: Secondary | ICD-10-CM | POA: Diagnosis not present

## 2020-07-12 DIAGNOSIS — Z6825 Body mass index (BMI) 25.0-25.9, adult: Secondary | ICD-10-CM | POA: Diagnosis not present

## 2020-07-12 DIAGNOSIS — I10 Essential (primary) hypertension: Secondary | ICD-10-CM | POA: Diagnosis not present

## 2020-07-12 DIAGNOSIS — M1710 Unilateral primary osteoarthritis, unspecified knee: Secondary | ICD-10-CM | POA: Diagnosis not present

## 2020-07-12 DIAGNOSIS — E119 Type 2 diabetes mellitus without complications: Secondary | ICD-10-CM | POA: Diagnosis not present

## 2020-08-10 DIAGNOSIS — R197 Diarrhea, unspecified: Secondary | ICD-10-CM | POA: Diagnosis not present

## 2020-10-06 DIAGNOSIS — Z86010 Personal history of colonic polyps: Secondary | ICD-10-CM | POA: Diagnosis not present

## 2020-10-06 DIAGNOSIS — R197 Diarrhea, unspecified: Secondary | ICD-10-CM | POA: Diagnosis not present

## 2020-10-06 DIAGNOSIS — D124 Benign neoplasm of descending colon: Secondary | ICD-10-CM | POA: Diagnosis not present

## 2020-10-12 DIAGNOSIS — E7800 Pure hypercholesterolemia, unspecified: Secondary | ICD-10-CM | POA: Diagnosis not present

## 2020-10-12 DIAGNOSIS — Z79899 Other long term (current) drug therapy: Secondary | ICD-10-CM | POA: Diagnosis not present

## 2020-10-12 DIAGNOSIS — E119 Type 2 diabetes mellitus without complications: Secondary | ICD-10-CM | POA: Diagnosis not present

## 2020-10-12 DIAGNOSIS — Z6825 Body mass index (BMI) 25.0-25.9, adult: Secondary | ICD-10-CM | POA: Diagnosis not present

## 2020-10-12 DIAGNOSIS — Z139 Encounter for screening, unspecified: Secondary | ICD-10-CM | POA: Diagnosis not present

## 2020-10-12 DIAGNOSIS — M1710 Unilateral primary osteoarthritis, unspecified knee: Secondary | ICD-10-CM | POA: Diagnosis not present

## 2020-10-12 DIAGNOSIS — M109 Gout, unspecified: Secondary | ICD-10-CM | POA: Diagnosis not present

## 2020-10-12 DIAGNOSIS — F32A Depression, unspecified: Secondary | ICD-10-CM | POA: Diagnosis not present

## 2020-10-12 DIAGNOSIS — K529 Noninfective gastroenteritis and colitis, unspecified: Secondary | ICD-10-CM | POA: Diagnosis not present

## 2020-10-12 DIAGNOSIS — Z9181 History of falling: Secondary | ICD-10-CM | POA: Diagnosis not present

## 2020-10-12 DIAGNOSIS — R600 Localized edema: Secondary | ICD-10-CM | POA: Diagnosis not present

## 2020-10-12 DIAGNOSIS — I10 Essential (primary) hypertension: Secondary | ICD-10-CM | POA: Diagnosis not present

## 2020-11-15 DIAGNOSIS — M542 Cervicalgia: Secondary | ICD-10-CM | POA: Diagnosis not present

## 2020-11-15 DIAGNOSIS — Z6825 Body mass index (BMI) 25.0-25.9, adult: Secondary | ICD-10-CM | POA: Diagnosis not present

## 2020-12-05 DIAGNOSIS — Z20822 Contact with and (suspected) exposure to covid-19: Secondary | ICD-10-CM | POA: Diagnosis not present

## 2021-02-01 DIAGNOSIS — K580 Irritable bowel syndrome with diarrhea: Secondary | ICD-10-CM | POA: Diagnosis not present

## 2021-02-09 DIAGNOSIS — F32A Depression, unspecified: Secondary | ICD-10-CM | POA: Diagnosis not present

## 2021-02-09 DIAGNOSIS — I10 Essential (primary) hypertension: Secondary | ICD-10-CM | POA: Diagnosis not present

## 2021-02-09 DIAGNOSIS — Z79899 Other long term (current) drug therapy: Secondary | ICD-10-CM | POA: Diagnosis not present

## 2021-02-09 DIAGNOSIS — Z6825 Body mass index (BMI) 25.0-25.9, adult: Secondary | ICD-10-CM | POA: Diagnosis not present

## 2021-02-09 DIAGNOSIS — K529 Noninfective gastroenteritis and colitis, unspecified: Secondary | ICD-10-CM | POA: Diagnosis not present

## 2021-02-09 DIAGNOSIS — E7800 Pure hypercholesterolemia, unspecified: Secondary | ICD-10-CM | POA: Diagnosis not present

## 2021-02-09 DIAGNOSIS — M109 Gout, unspecified: Secondary | ICD-10-CM | POA: Diagnosis not present

## 2021-02-09 DIAGNOSIS — R600 Localized edema: Secondary | ICD-10-CM | POA: Diagnosis not present

## 2021-02-09 DIAGNOSIS — Z8639 Personal history of other endocrine, nutritional and metabolic disease: Secondary | ICD-10-CM | POA: Diagnosis not present

## 2021-02-09 DIAGNOSIS — M1710 Unilateral primary osteoarthritis, unspecified knee: Secondary | ICD-10-CM | POA: Diagnosis not present

## 2021-02-15 DIAGNOSIS — E785 Hyperlipidemia, unspecified: Secondary | ICD-10-CM | POA: Diagnosis not present

## 2021-02-15 DIAGNOSIS — Z9181 History of falling: Secondary | ICD-10-CM | POA: Diagnosis not present

## 2021-02-15 DIAGNOSIS — Z0000 Encounter for general adult medical examination without abnormal findings: Secondary | ICD-10-CM | POA: Diagnosis not present

## 2021-02-15 DIAGNOSIS — Z1331 Encounter for screening for depression: Secondary | ICD-10-CM | POA: Diagnosis not present

## 2021-05-15 DIAGNOSIS — Z6826 Body mass index (BMI) 26.0-26.9, adult: Secondary | ICD-10-CM | POA: Diagnosis not present

## 2021-05-15 DIAGNOSIS — I10 Essential (primary) hypertension: Secondary | ICD-10-CM | POA: Diagnosis not present

## 2021-05-15 DIAGNOSIS — Z8639 Personal history of other endocrine, nutritional and metabolic disease: Secondary | ICD-10-CM | POA: Diagnosis not present

## 2021-05-15 DIAGNOSIS — R600 Localized edema: Secondary | ICD-10-CM | POA: Diagnosis not present

## 2021-05-15 DIAGNOSIS — K529 Noninfective gastroenteritis and colitis, unspecified: Secondary | ICD-10-CM | POA: Diagnosis not present

## 2021-05-15 DIAGNOSIS — F32A Depression, unspecified: Secondary | ICD-10-CM | POA: Diagnosis not present

## 2021-05-15 DIAGNOSIS — M109 Gout, unspecified: Secondary | ICD-10-CM | POA: Diagnosis not present

## 2021-05-15 DIAGNOSIS — M1710 Unilateral primary osteoarthritis, unspecified knee: Secondary | ICD-10-CM | POA: Diagnosis not present

## 2021-05-15 DIAGNOSIS — Z79899 Other long term (current) drug therapy: Secondary | ICD-10-CM | POA: Diagnosis not present

## 2021-05-15 DIAGNOSIS — Z87891 Personal history of nicotine dependence: Secondary | ICD-10-CM | POA: Diagnosis not present

## 2021-05-15 DIAGNOSIS — E7800 Pure hypercholesterolemia, unspecified: Secondary | ICD-10-CM | POA: Diagnosis not present

## 2021-06-28 DIAGNOSIS — M1711 Unilateral primary osteoarthritis, right knee: Secondary | ICD-10-CM | POA: Diagnosis not present

## 2021-08-15 DIAGNOSIS — M1710 Unilateral primary osteoarthritis, unspecified knee: Secondary | ICD-10-CM | POA: Diagnosis not present

## 2021-08-15 DIAGNOSIS — R600 Localized edema: Secondary | ICD-10-CM | POA: Diagnosis not present

## 2021-08-15 DIAGNOSIS — Z6826 Body mass index (BMI) 26.0-26.9, adult: Secondary | ICD-10-CM | POA: Diagnosis not present

## 2021-08-15 DIAGNOSIS — K580 Irritable bowel syndrome with diarrhea: Secondary | ICD-10-CM | POA: Diagnosis not present

## 2021-08-15 DIAGNOSIS — E7800 Pure hypercholesterolemia, unspecified: Secondary | ICD-10-CM | POA: Diagnosis not present

## 2021-08-15 DIAGNOSIS — Z79899 Other long term (current) drug therapy: Secondary | ICD-10-CM | POA: Diagnosis not present

## 2021-08-15 DIAGNOSIS — M109 Gout, unspecified: Secondary | ICD-10-CM | POA: Diagnosis not present

## 2021-08-15 DIAGNOSIS — F32A Depression, unspecified: Secondary | ICD-10-CM | POA: Diagnosis not present

## 2021-08-15 DIAGNOSIS — I10 Essential (primary) hypertension: Secondary | ICD-10-CM | POA: Diagnosis not present

## 2021-09-06 DIAGNOSIS — M1711 Unilateral primary osteoarthritis, right knee: Secondary | ICD-10-CM | POA: Diagnosis not present

## 2021-09-13 DIAGNOSIS — M1711 Unilateral primary osteoarthritis, right knee: Secondary | ICD-10-CM | POA: Diagnosis not present

## 2021-09-20 DIAGNOSIS — M1711 Unilateral primary osteoarthritis, right knee: Secondary | ICD-10-CM | POA: Diagnosis not present

## 2021-10-17 ENCOUNTER — Emergency Department (HOSPITAL_COMMUNITY): Payer: Medicare Other

## 2021-10-17 ENCOUNTER — Encounter (HOSPITAL_COMMUNITY): Payer: Self-pay

## 2021-10-17 ENCOUNTER — Other Ambulatory Visit: Payer: Self-pay

## 2021-10-17 ENCOUNTER — Observation Stay (HOSPITAL_COMMUNITY)
Admission: EM | Admit: 2021-10-17 | Discharge: 2021-10-18 | Disposition: A | Discharge status: Home / Self Care | Payer: Medicare Other | Attending: Internal Medicine | Admitting: Internal Medicine

## 2021-10-17 DIAGNOSIS — R29818 Other symptoms and signs involving the nervous system: Secondary | ICD-10-CM | POA: Diagnosis not present

## 2021-10-17 DIAGNOSIS — E871 Hypo-osmolality and hyponatremia: Secondary | ICD-10-CM | POA: Insufficient documentation

## 2021-10-17 DIAGNOSIS — F1010 Alcohol abuse, uncomplicated: Secondary | ICD-10-CM | POA: Diagnosis not present

## 2021-10-17 DIAGNOSIS — E119 Type 2 diabetes mellitus without complications: Secondary | ICD-10-CM | POA: Diagnosis not present

## 2021-10-17 DIAGNOSIS — E785 Hyperlipidemia, unspecified: Secondary | ICD-10-CM | POA: Diagnosis not present

## 2021-10-17 DIAGNOSIS — Z20822 Contact with and (suspected) exposure to covid-19: Secondary | ICD-10-CM | POA: Diagnosis not present

## 2021-10-17 DIAGNOSIS — F10129 Alcohol abuse with intoxication, unspecified: Secondary | ICD-10-CM | POA: Insufficient documentation

## 2021-10-17 DIAGNOSIS — R5381 Other malaise: Secondary | ICD-10-CM | POA: Insufficient documentation

## 2021-10-17 DIAGNOSIS — I10 Essential (primary) hypertension: Secondary | ICD-10-CM | POA: Insufficient documentation

## 2021-10-17 DIAGNOSIS — Z79899 Other long term (current) drug therapy: Secondary | ICD-10-CM | POA: Insufficient documentation

## 2021-10-17 DIAGNOSIS — R4182 Altered mental status, unspecified: Secondary | ICD-10-CM | POA: Diagnosis not present

## 2021-10-17 DIAGNOSIS — R55 Syncope and collapse: Principal | ICD-10-CM | POA: Diagnosis present

## 2021-10-17 DIAGNOSIS — I959 Hypotension, unspecified: Secondary | ICD-10-CM | POA: Diagnosis not present

## 2021-10-17 DIAGNOSIS — R4020 Unspecified coma: Secondary | ICD-10-CM | POA: Diagnosis not present

## 2021-10-17 DIAGNOSIS — F10920 Alcohol use, unspecified with intoxication, uncomplicated: Secondary | ICD-10-CM

## 2021-10-17 DIAGNOSIS — R404 Transient alteration of awareness: Secondary | ICD-10-CM | POA: Diagnosis not present

## 2021-10-17 LAB — DIFFERENTIAL
Abs Immature Granulocytes: 0.04 10*3/uL (ref 0.00–0.07)
Basophils Absolute: 0.1 10*3/uL (ref 0.0–0.1)
Basophils Relative: 1 %
Eosinophils Absolute: 0.1 10*3/uL (ref 0.0–0.5)
Eosinophils Relative: 1 %
Immature Granulocytes: 1 %
Lymphocytes Relative: 25 %
Lymphs Abs: 2 10*3/uL (ref 0.7–4.0)
Monocytes Absolute: 0.8 10*3/uL (ref 0.1–1.0)
Monocytes Relative: 9 %
Neutro Abs: 5.2 10*3/uL (ref 1.7–7.7)
Neutrophils Relative %: 63 %

## 2021-10-17 LAB — APTT: aPTT: 30 seconds (ref 24–36)

## 2021-10-17 LAB — I-STAT CHEM 8, ED
BUN: 19 mg/dL (ref 8–23)
Calcium, Ion: 1.02 mmol/L — ABNORMAL LOW (ref 1.15–1.40)
Chloride: 104 mmol/L (ref 98–111)
Creatinine, Ser: 1.2 mg/dL (ref 0.61–1.24)
Glucose, Bld: 92 mg/dL (ref 70–99)
HCT: 33 % — ABNORMAL LOW (ref 39.0–52.0)
Hemoglobin: 11.2 g/dL — ABNORMAL LOW (ref 13.0–17.0)
Potassium: 4.3 mmol/L (ref 3.5–5.1)
Sodium: 135 mmol/L (ref 135–145)
TCO2: 18 mmol/L — ABNORMAL LOW (ref 22–32)

## 2021-10-17 LAB — COMPREHENSIVE METABOLIC PANEL
ALT: 17 U/L (ref 0–44)
AST: 25 U/L (ref 15–41)
Albumin: 3 g/dL — ABNORMAL LOW (ref 3.5–5.0)
Alkaline Phosphatase: 39 U/L (ref 38–126)
Anion gap: 10 (ref 5–15)
BUN: 16 mg/dL (ref 8–23)
CO2: 17 mmol/L — ABNORMAL LOW (ref 22–32)
Calcium: 7.7 mg/dL — ABNORMAL LOW (ref 8.9–10.3)
Chloride: 105 mmol/L (ref 98–111)
Creatinine, Ser: 1.03 mg/dL (ref 0.61–1.24)
GFR, Estimated: >60 mL/min (ref >60)
Glucose, Bld: 93 mg/dL (ref 70–99)
Potassium: 4.2 mmol/L (ref 3.5–5.1)
Sodium: 132 mmol/L — ABNORMAL LOW (ref 135–145)
Total Bilirubin: 1 mg/dL (ref 0.3–1.2)
Total Protein: 5.5 g/dL — ABNORMAL LOW (ref 6.5–8.1)

## 2021-10-17 LAB — CBC
HCT: 32.9 % — ABNORMAL LOW (ref 39.0–52.0)
Hemoglobin: 11.3 g/dL — ABNORMAL LOW (ref 13.0–17.0)
MCH: 35.2 pg — ABNORMAL HIGH (ref 26.0–34.0)
MCHC: 34.3 g/dL (ref 30.0–36.0)
MCV: 102.5 fL — ABNORMAL HIGH (ref 80.0–100.0)
Platelets: 125 10*3/uL — ABNORMAL LOW (ref 150–400)
RBC: 3.21 MIL/uL — ABNORMAL LOW (ref 4.22–5.81)
RDW: 12.9 % (ref 11.5–15.5)
WBC: 8.2 10*3/uL (ref 4.0–10.5)
nRBC: 0 % (ref 0.0–0.2)

## 2021-10-17 LAB — PROTIME-INR
INR: 1.2 (ref 0.8–1.2)
Prothrombin Time: 14.9 seconds (ref 11.4–15.2)

## 2021-10-17 LAB — CBG MONITORING, ED
Glucose-Capillary: 91 mg/dL (ref 70–99)
Glucose-Capillary: 67 mg/dL — ABNORMAL LOW (ref 70–99)
Glucose-Capillary: 84 mg/dL (ref 70–99)

## 2021-10-17 LAB — RESP PANEL BY RT-PCR (FLU A&B, COVID) ARPGX2
Influenza A by PCR: NEGATIVE
Influenza B by PCR: NEGATIVE
SARS Coronavirus 2 by RT PCR: NEGATIVE

## 2021-10-17 LAB — ETHANOL: Alcohol, Ethyl (B): 114 mg/dL — ABNORMAL HIGH (ref <10)

## 2021-10-17 LAB — TROPONIN I (HIGH SENSITIVITY): Troponin I (High Sensitivity): 4 ng/L (ref <18)

## 2021-10-17 MED ORDER — DEXTROSE 50 % IV SOLN: 1.0000 | INTRAVENOUS | Status: DC | PRN

## 2021-10-17 MED ORDER — THIAMINE HCL 100 MG/ML IJ SOLN
Freq: Once | INTRAVENOUS | Status: AC
  Filled 2021-10-17: qty 1000

## 2021-10-17 MED ORDER — INSULIN ASPART 100 UNIT/ML IJ SOLN
0.0000 [IU] | Freq: Three times a day (TID) | INTRAMUSCULAR | Status: DC
Start: 2021-10-18 — End: 2021-10-17

## 2021-10-17 MED ORDER — PROSIGHT PO TABS
1.0000 | ORAL_TABLET | Freq: Every day | ORAL | Status: DC
  Administered 2021-10-18: 1 via ORAL
  Filled 2021-10-17: qty 1

## 2021-10-17 MED ORDER — ACETAMINOPHEN 325 MG PO TABS: 650.0000 mg | ORAL_TABLET | Freq: Four times a day (QID) | ORAL | Status: DC | PRN

## 2021-10-17 MED ORDER — THIAMINE HCL 100 MG PO TABS
100.0000 mg | ORAL_TABLET | Freq: Every day | ORAL | Status: DC
  Administered 2021-10-18: 100 mg via ORAL
  Filled 2021-10-17: qty 1

## 2021-10-17 MED ORDER — FOLIC ACID 1 MG PO TABS
1.0000 mg | ORAL_TABLET | Freq: Every day | ORAL | Status: DC
  Administered 2021-10-18: 1 mg via ORAL
  Filled 2021-10-17: qty 1

## 2021-10-17 MED ORDER — LORAZEPAM 2 MG/ML IJ SOLN: 1.0000 mg | INTRAMUSCULAR | Status: DC | PRN

## 2021-10-17 MED ORDER — ACETAMINOPHEN 650 MG RE SUPP: 650.0000 mg | Freq: Four times a day (QID) | RECTAL | Status: DC | PRN

## 2021-10-17 MED ORDER — SODIUM CHLORIDE 0.9% FLUSH
3.0000 mL | Freq: Once | INTRAVENOUS | Status: AC
  Administered 2021-10-17: 3 mL via INTRAVENOUS

## 2021-10-17 NOTE — H&P (Signed)
History and Physical    PLEASE NOTE THAT DRAGON DICTATION SOFTWARE WAS USED IN THE CONSTRUCTION OF THIS NOTE.   Luis Kim HAL:[id number] DOB: Oct 19, 1946 DOA: 10/17/2021  PCP: Leonides Sake, MD  Patient coming from: home   I have personally briefly reviewed patient's old medical records in [place]  Chief Complaint: Diminished responsiveness  HPI: Luis Kim is a 75 y.o. male with medical history significant for type 2 diabetes mellitus 10 minutes for list of medications, chronic alcohol abuse, hypertension, hyperlipidemia who is admitted to [facility] on 10/17/2021 with suspected syncope after presenting from home to [facility] ED for evaluation of an episode diminished responsiveness.   Around 1700 on 10/17/2021, patient was at home, sitting on his couch watching television, he was noted will exhibit evidence of diminished responsiveness, prompting family to contact EMS.  No reported associated tonic-clonic activity, nor any tongue biting, nor any evidence of loss of bowel/bladder function.  Patient does not recall the events specifically to his diminished responsiveness episode.  Denies any recent chest pain, shortness of breath, dizziness, diaphoresis, dizziness, or presyncope.  No recent preceding trauma.  No history of seizure activity.  He confirms a history of chronic alcohol abuse, which he consumes at least a sixpack of beer on a daily basis and has been consuming alcohol at this appointment frequency for several years now.  He knowledges a history of hypertension, with notable outpatient medications include Norvasc, Coreg, will.  Denies any associated acute focal weakness, paresthesias, numbness, dysarthria, acute change in vision, facial droop, dysphagia, or vertigo.    ED Course:  Vital signs in the ED were notable for the following: Afebrile, heart rate 58-65, blood pressure 129/66; respiratory rate 16, oxygen saturation 9700% on room air.  Labs were  notable for the following: CMP notable for sodium 130 compared to most recent prior value of 138 in 2015, current 17, anion gap 10, creatinine 1.03, glucose 93.  Troponin 4.  CBC notable for white blood cell count 8200, hemoglobin 11.3.  INR 1.2.  Serum ethanol level 114.  Urinary drug screen result pending.  COVID-19/[place] PCR screen ordered, with results currently pending.  Imaging and additional notable ED work-up: EKG shows sinus rhythm with incomplete left bundle branch block, heart rate 81, no evidence of T wave or ST changes.  Chest x-ray showed no evidence of acute cardiopulmonary process.  CT head showed patchy intermediate low-density change within the bilateral basal ganglia which, per radiology, may represent microvascular ischemic change versus age-indeterminate lacunar infarcts, without evidence of a large territory infarction, and no evidence of intracranial hemorrhage.  Dr. Quinn Axe of neurology was consulted, and felt that presentation was suggestive of acute stroke, MRI brain ordered to further evaluate, with results currently pending.    Review of Systems: As per HPI otherwise 10 point review of systems negative.   Past Medical History:  Diagnosis Date   Arthritis    Diabetes mellitus without complication ([place])    no meds   Hyperlipidemia    [facility] tick fever 5/15   positive    Shortness of breath    doe   Stroke [facility])    tia-no residual   Wears glasses    Wears partial dentures     Past Surgical History:  Procedure Laterality Date   INGUINAL HERNIA REPAIR Right 09/14/2014   Procedure: OPEN REPAIR RIGHT INGUINAL HERNIA;  Surgeon: Fanny Skates, MD;  Location: [place];  Service: General;  Laterality:  Right;   INSERTION OF MESH Left 09/14/2014   Procedure: INSERTION OF MESH;  Surgeon: Fanny Skates, MD;  Location: [place];  Service: General;  Laterality: Left;   KNEE SURGERY     right x3    Social History:  reports that he  has never smoked. His smokeless tobacco use includes chew. He reports current alcohol use. He reports that he does not use drugs.   No Known Allergies  Family History  Problem Relation Age of Onset   Hypertension Other    Cancer Other    Cancer Mother        ovarian   Cancer Father        leukemia     Prior to Admission medications   Medication Sig Start Date End Date Taking? Authorizing Provider  allopurinol (ZYLOPRIM) 300 MG tablet Take 300 mg by mouth daily.      [provider]  amLODipine (NORVASC) 10 MG tablet Take 10 mg by mouth daily.      [provider]  Calcium Carbonate-Vitamin D (CALCIUM-VITAMIN D) 500-200 MG-UNIT per tablet Take 1 tablet by mouth 2 (two) times daily.     [provider]  carvedilol (COREG) 12.5 MG tablet Take 12.5 mg by mouth 2 (two) times daily with a meal.      [provider]  fluticasone (FLONASE) 50 MCG/ACT nasal spray Place 1 spray into both nostrils daily as needed for allergies or rhinitis.    [provider]  furosemide (LASIX) 20 MG tablet Take 40 mg by mouth daily as needed for fluid.     [provider]  HYDROcodone-acetaminophen (NORCO/VICODIN) 5-325 MG per tablet Take 1 tablet by mouth every 6 (six) hours as needed for moderate pain.    [provider]  oxyCODONE-acetaminophen (PERCOCET) 7.5-325 MG per tablet Take 1 tablet by mouth every 4 (four) hours as needed for pain. 09/14/14   Fanny Skates, MD  ramipril (ALTACE) 10 MG capsule Take 20 mg by mouth daily.     [provider]  simvastatin (ZOCOR) 20 MG tablet Take 20 mg by mouth at bedtime.      [provider]  tetrahydrozoline (VISINE) 0.05 % ophthalmic solution Place 1 drop into both eyes daily as needed (for irritation).    [provider]     Objective    Physical Exam: Vitals:   10/17/21 1845 10/17/21 1915 10/17/21 1945 10/17/21 2045  BP: (!) 144/60 130/62 (!) 149/67 (!) 153/64   Pulse: (!) 54 63 (!) 59 64  Resp: 11 16 (!) 21 15  Temp:      TempSrc:      SpO2: 99% 98% 98% 97%    General: appears to be stated age; alert, oriented Skin: warm, dry, no rash Head:  AT/[place] Mouth:  Oral mucosa membranes appear moist, normal dentition Neck: supple; trachea midline Heart:  RRR; did not appreciate any M/R/G Lungs: CTAB, did not appreciate any wheezes, rales, or rhonchi Abdomen: + BS; soft, ND, NT Vascular: 2+ pedal pulses b/l; 2+ radial pulses b/l Extremities: no peripheral edema, no muscle wasting Neuro: strength and sensation intact in upper and lower extremities b/l     Labs on Admission: I have personally reviewed following labs and imaging studies  CBC: Recent Labs  Lab 10/17/21 1826  WBC 8.2  NEUTROABS 5.2  HGB 11.3*  11.2*  HCT 32.9*  33.0*  MCV 102.5*  PLT 937*   Basic Metabolic Panel: Recent Labs  Lab  10/17/21 1826  NA 132*  135  K 4.2  4.3  CL 105  104  CO2 17*  GLUCOSE 93  92  BUN 16  19  CREATININE 1.03  1.20  CALCIUM 7.7*   GFR: CrCl cannot be calculated (Unknown ideal weight.). Liver Function Tests: Recent Labs  Lab 10/17/21 1826  AST 25  ALT 17  ALKPHOS 39  BILITOT 1.0  PROT 5.5*  ALBUMIN 3.0*   No results for input(s): LIPASE, AMYLASE in the last 168 hours. No results for input(s): AMMONIA in the last 168 hours. Coagulation Profile: Recent Labs  Lab 10/17/21 1826  INR 1.2   Cardiac Enzymes: No results for input(s): CKTOTAL, CKMB, CKMBINDEX, TROPONINI in the last 168 hours. BNP (last 3 results) No results for input(s): PROBNP in the last 8760 hours. HbA1C: No results for input(s): HGBA1C in the last 72 hours. CBG: Recent Labs  Lab 10/17/21 1821  GLUCAP 91   Lipid Profile: No results for input(s): CHOL, HDL, LDLCALC, TRIG, CHOLHDL, LDLDIRECT in the last 72 hours. Thyroid Function Tests: No results for input(s): TSH, T4TOTAL, FREET4, T3FREE, THYROIDAB in the last 72 hours. Anemia Panel: No  results for input(s): VITAMINB12, FOLATE, FERRITIN, TIBC, IRON, RETICCTPCT in the last 72 hours. Urine analysis: No results found for: COLORURINE, APPEARANCEUR, LABSPEC, PHURINE, GLUCOSEU, HGBUR, BILIRUBINUR, [place], PROTEINUR, UROBILINOGEN, NITRITE, LEUKOCYTESUR  Radiological Exams on Admission: CT HEAD WO CONTRAST (5MM)  Result Date: 10/17/2021 CLINICAL DATA:  Neuro deficit, acute, stroke suspected EXAM: CT HEAD WITHOUT CONTRAST TECHNIQUE: Contiguous axial images were obtained from the base of the skull through the vertex without intravenous contrast. COMPARISON:  None. FINDINGS: Brain: No evidence of large territory acute infarction, hemorrhage, hydrocephalus, extra-axial collection or mass lesion/mass effect. Patchy intermediate-low density changes within the bilateral basal ganglia. Mild age related cerebral volume loss. Vascular: Atherosclerotic calcifications involving the large vessels of the skull base. No unexpected hyperdense vessel. Skull: Normal. Negative for fracture or focal lesion. Sinuses/Orbits: No acute finding. Other: None. IMPRESSION: 1. Patchy intermediate-low density changes within the bilateral basal ganglia, which may represent microvascular ischemic change versus age-indeterminate lacunar infarcts. Further evaluation with MRI is recommended. 2. No evidence of large territory infarction. 3. Mild age related cerebral volume loss. Electronically Signed   By: Davina Poke D.O.   On: 10/17/2021 19:28   DG Chest Portable 1 View  Result Date: 10/17/2021 CLINICAL DATA:  Syncope EXAM: PORTABLE CHEST 1 VIEW COMPARISON:  04/24/2014 FINDINGS: The heart size and mediastinal contours are within normal limits. Both lungs are clear. The visualized skeletal structures are unremarkable. IMPRESSION: No active disease. Electronically Signed   By: Donavan Foil M.D.   On: 10/17/2021 20:21     EKG: Independently reviewed, with result as described above.    Assessment/Plan   Principal  Problem:   Syncope Active Problems:   Dyslipidemia   Hyponatremia   HTN (hypertension)   Chronic alcohol abuse    #) Syncope: Episode of diminished responsiveness, suspicious for syncope.  Seizures are possibility, given the patient's history of alcohol abuse, appears to consistently consume a similar volume, thereby decreasing risk for withdrawal induced seizures.  We will further evaluate for any evidence of acute ischemic stroke, with MRI brain currently pending, per evaluation and ensuing recommendations of neurology consultation, as above.  Differential also includes vasovagal syncope versus orthostatic hypotension given clinical evidence of intravascular depletion, with diminished compensatory tachycardic/vasoconstrictive response.  Plan: Increased interstitial medication requesting orthostatic vital signs be checked and documented, following which initiated  banana bag.  Monitor strict I's and O's Daily weights.  Follow-up result MRI brain.  Every 4 hours neurochecks.  Monitor on telemetry.      #) Hypoosmolar hyponatremia: Presenting sodium 132.  Most recent prior value 138 although this was approximately 7 years ago of, rendering the chronicity unclear.  Suspect an element of chronic hyponatremia due to beer drinkers potomania given the patient's acknowledgment of chronic alcohol abuse.  No evidence of acute volume overload at this time.   Plan: Check urinalysis, random urine 7, serum osmolality, TSH, monitor strict I's and O's and daily weights.  Repeat BMP in the morning.      #) Chronic alcohol abuse with acute alcohol intoxication: Daily alcohol consumption, as quantified above with serum ethanol level 114.  Most recent alcohol consumed earlier today.  No evidence at this time acute alcohol withdrawal.  Plan: Banana bag, followed by transition to oral thiamine, folic acid, multivitamin supplementation.  Counseled patient on importance of reduction in alcohol consumption.  Has  been placed in transition of care team.  Repeat CMP in the morning add on serum magnesium and serum phosphorus levels.  Monitor on telemetry.      #) Hyperlipidemia: Documented history of such, on simvastatin as an outpatient.  Plan: Resume home simvastatin.     #) Essential hypertension: On Coreg, Norvasc, ramipril as outpatient.  Pressures have been normotensive in the ED this morning.  Differential includes orthostatic hypotension, hold home and hypertensive medications, further assessing for orthostatic hypotension.  Plan: close monitoring of ensuing bp, including assessment of orthostatic vital signs, as further detailed below.  For now, will hold home antihypertensive medications. Monitor on Telemetry.     DVT prophylaxis: SCD's   Code Status: Full code Disposition Plan: Per Rounding Team Consults called: none;  Admission status: Observation; med telemetry   PLEASE NOTE THAT DRAGON DICTATION SOFTWARE WAS USED IN THE CONSTRUCTION OF THIS NOTE.   [place] DO [facility]  From [place]   10/17/2021, 9:26 PM

## 2021-10-17 NOTE — Progress Notes (Signed)
Neurology Stroke Code Cancellation Note  Stroke code was preactivated by EMS for this patient who was found unresponsive. SBP on their arrival was in 60s, which improved to 80s after starting fluids. He had no focal deficits on arrival to ED after fluid resuscitation, stroke scale 0, was joking about all of the things he is going to cook for Thanksgiving dinner tomorrow. Sx attributed to symptomatic severe hypotension and completely resolved after BP improved, therefore no indication for stroke code and it was cancelled.  Su Monks, MD [facility] [phone]  If 7pm- 7am, please page neurology on call as listed in [place].

## 2021-10-17 NOTE — ED Provider Notes (Signed)
[name] EMERGENCY DEPARTMENT Provider Note   CSN: [id number] Arrival date & time: 10/17/21  1818  An emergency department physician performed an initial assessment on this suspected stroke patient at 1820.  History Chief Complaint  Patient presents with   Altered Mental Status    Luis Kim is a 75 y.o. male.   Altered Mental Status  Patient presented to the emergency room for evaluation of an episode of altered mental status.  Patient was sitting watching TV when he started to slump to the left side and became unresponsive.  EMS states that the patient was unresponsive on arrival.  They had to do a sternal rub.  Pressure was low in the 60s.  They had to provide bag-valve-mask ventilation.  Approximately 5 minutes after initiation of treatment the patient then became more alert and awake.  Code stroke was initially activated in the field.  On arrival patient states that he remembers sitting down to watch TV.  He had some beers and a few shots.  He took one of his oxycodone as well this evening.  Next thing he remembers is the paramedics on either side of him.  He is not having any headache.  He denies any chest pain.  He denies any focal numbness or weakness  Past Medical History:  Diagnosis Date   Arthritis    Diabetes mellitus without complication ([place])    no meds   Hyperlipidemia    [facility] tick fever 5/15   positive    Shortness of breath    doe   Stroke [facility])    tia-no residual   Wears glasses    Wears partial dentures     Patient Active Problem List   Diagnosis Date Noted   Right inguinal hernia 10/07/2013   Chest discomfort 07/23/2011   Dyslipidemia 07/23/2011    Past Surgical History:  Procedure Laterality Date   INGUINAL HERNIA REPAIR Right 09/14/2014   Procedure: OPEN REPAIR RIGHT INGUINAL HERNIA;  Surgeon: Fanny Skates, MD;  Location: [facility];  Service: General;  Laterality: Right;   INSERTION OF MESH  Left 09/14/2014   Procedure: INSERTION OF MESH;  Surgeon: Fanny Skates, MD;  Location: [facility];  Service: General;  Laterality: Left;   KNEE SURGERY     right x3       Family History  Problem Relation Age of Onset   Hypertension Other    Cancer Other    Cancer Mother        ovarian   Cancer Father        leukemia    Social History   Tobacco Use   Smoking status: Never   Smokeless tobacco: Current    Types: Chew  Substance Use Topics   Alcohol use: Yes    Comment: chronic heavy drinker   Drug use: No    Home Medications Prior to Admission medications   Medication Sig Start Date End Date Taking? Authorizing Provider  allopurinol (ZYLOPRIM) 300 MG tablet Take 300 mg by mouth daily.      [provider]  amLODipine (NORVASC) 10 MG tablet Take 10 mg by mouth daily.      [provider]  Calcium Carbonate-Vitamin D (CALCIUM-VITAMIN D) 500-200 MG-UNIT per tablet Take 1 tablet by mouth 2 (two) times daily.     [provider]  carvedilol (COREG) 12.5 MG tablet Take 12.5 mg by mouth 2 (two) times daily with a meal.  [provider]  fluticasone (FLONASE) 50 MCG/ACT nasal spray Place 1 spray into both nostrils daily as needed for allergies or rhinitis.    [provider]  furosemide (LASIX) 20 MG tablet Take 40 mg by mouth daily as needed for fluid.     [provider]  HYDROcodone-acetaminophen (NORCO/VICODIN) 5-325 MG per tablet Take 1 tablet by mouth every 6 (six) hours as needed for moderate pain.    [provider]  oxyCODONE-acetaminophen (PERCOCET) 7.5-325 MG per tablet Take 1 tablet by mouth every 4 (four) hours as needed for pain. 09/14/14   Fanny Skates, MD  ramipril (ALTACE) 10 MG capsule Take 20 mg by mouth daily.     [provider]  simvastatin (ZOCOR) 20 MG tablet Take 20 mg by mouth at bedtime.      [provider]  tetrahydrozoline (VISINE) 0.05 % ophthalmic  solution Place 1 drop into both eyes daily as needed (for irritation).    [provider]    Allergies    Patient has no known allergies.  Review of Systems   Review of Systems  All other systems reviewed and are negative.  Physical Exam Updated Vital Signs BP (!) 149/67   Pulse (!) 59   Temp (!) 97.5 F (36.4 C) (Oral)   Resp (!) 21   SpO2 98%   Physical Exam Vitals and nursing note reviewed.  Constitutional:      General: He is not in acute distress.    Appearance: He is well-developed.  HENT:     Head: Normocephalic and atraumatic.     Right Ear: External ear normal.     Left Ear: External ear normal.  Eyes:     General: No scleral icterus.       Right eye: No discharge.        Left eye: No discharge.     Conjunctiva/sclera: Conjunctivae normal.  Neck:     Trachea: No tracheal deviation.  Cardiovascular:     Rate and Rhythm: Normal rate and regular rhythm.  Pulmonary:     Effort: Pulmonary effort is normal. No respiratory distress.     Breath sounds: Normal breath sounds. No stridor. No wheezing or rales.  Abdominal:     General: Bowel sounds are normal. There is no distension.     Palpations: Abdomen is soft.     Tenderness: There is no abdominal tenderness. There is no guarding or rebound.  Musculoskeletal:        General: No tenderness or deformity.     Cervical back: Neck supple.  Skin:    General: Skin is warm and dry.     Findings: No rash.  Neurological:     General: No focal deficit present.     Mental Status: He is alert and oriented to person, place, and time.     Cranial Nerves: No cranial nerve deficit (no facial droop, extraocular movements intact, no slurred speech).     Sensory: No sensory deficit.     Motor: No abnormal muscle tone or seizure activity.     Coordination: Coordination normal.     Comments: Patient able to lift both arms and legs off the bed, no pronator drift  Psychiatric:        Mood and Affect: Mood normal.     ED Results / Procedures / Treatments   Labs (all labs ordered are listed, but only abnormal results are displayed) Labs Reviewed  CBC - Abnormal; Notable for the following components:  Result Value   RBC 3.21 (*)    Hemoglobin 11.3 (*)    HCT 32.9 (*)    MCV 102.5 (*)    MCH 35.2 (*)    Platelets 125 (*)    All other components within normal limits  COMPREHENSIVE METABOLIC PANEL - Abnormal; Notable for the following components:   Sodium 132 (*)    CO2 17 (*)    Calcium 7.7 (*)    Total Protein 5.5 (*)    Albumin 3.0 (*)    All other components within normal limits  ETHANOL - Abnormal; Notable for the following components:   Alcohol, Ethyl (B) 114 (*)    All other components within normal limits  I-STAT CHEM 8, ED - Abnormal; Notable for the following components:   Calcium, Ion 1.02 (*)    TCO2 18 (*)    Hemoglobin 11.2 (*)    HCT 33.0 (*)    All other components within normal limits  PROTIME-INR  APTT  DIFFERENTIAL  RAPID URINE DRUG SCREEN, HOSP PERFORMED  CBG MONITORING, ED  TROPONIN I (HIGH SENSITIVITY)    EKG EKG Interpretation  Date/Time:  Wednesday October 17 2021 18:29:52 EST Ventricular Rate:  61 PR Interval:  199 QRS Duration: 109 QT Interval:  458 QTC Calculation: 462 R Axis:   -47 Text Interpretation: Sinus rhythm Incomplete left bundle branch block , new since last tracing Confirmed by Dorie Rank [phone]) on 10/17/2021 6:32:47 PM  Radiology CT HEAD WO CONTRAST (5MM)  Result Date: 10/17/2021 CLINICAL DATA:  Neuro deficit, acute, stroke suspected EXAM: CT HEAD WITHOUT CONTRAST TECHNIQUE: Contiguous axial images were obtained from the base of the skull through the vertex without intravenous contrast. COMPARISON:  None. FINDINGS: Brain: No evidence of large territory acute infarction, hemorrhage, hydrocephalus, extra-axial collection or mass lesion/mass effect. Patchy intermediate-low density changes within the bilateral basal ganglia. Mild age  related cerebral volume loss. Vascular: Atherosclerotic calcifications involving the large vessels of the skull base. No unexpected hyperdense vessel. Skull: Normal. Negative for fracture or focal lesion. Sinuses/Orbits: No acute finding. Other: None. IMPRESSION: 1. Patchy intermediate-low density changes within the bilateral basal ganglia, which may represent microvascular ischemic change versus age-indeterminate lacunar infarcts. Further evaluation with MRI is recommended. 2. No evidence of large territory infarction. 3. Mild age related cerebral volume loss. Electronically Signed   By: Davina Poke D.O.   On: 10/17/2021 19:28   DG Chest Portable 1 View  Result Date: 10/17/2021 CLINICAL DATA:  Syncope EXAM: PORTABLE CHEST 1 VIEW COMPARISON:  04/24/2014 FINDINGS: The heart size and mediastinal contours are within normal limits. Both lungs are clear. The visualized skeletal structures are unremarkable. IMPRESSION: No active disease. Electronically Signed   By: Donavan Foil M.D.   On: 10/17/2021 20:21    Procedures Procedures   Medications Ordered in ED Medications  sodium chloride flush (NS) 0.9 % injection 3 mL (3 mLs Intravenous Given 10/17/21 1840)    ED Course  I have reviewed the triage vital signs and the nursing notes.  Pertinent labs & imaging results that were available during my care of the patient were reviewed by me and considered in my medical decision making (see chart for details).    MDM Rules/Calculators/A&P                          Patient was seen by the stroke team on arrival.  Code stroke was canceled per neurology.  Patient does not have any  focal deficits at this time and his symptoms are improving.  We will continue to evaluate for possible syncopal episode seizure or stroke.  Also possible his symptoms were related to his alcohol use this evening  Patient presented to the ER for evaluation of an episode of unresponsiveness.  EMS reported hypotension.  Here in  the ED he has been normotensive.  Code stroke was activated in the field but he was seen by Dr. Quinn Axe neurology and code stroke was canceled.  Patient has remained alert and oriented here in the ED.  He does admit to some alcohol use this evening.  Patient's laboratory tests show mild anemia but doubt this would cause any of his symptoms.  Laboratory tests are notable for decreased bicarb.  Alcohol level is elevated.  CT scan does not show any definite abnormalities but there is the possibility of age-indeterminate infarcts.  I will order an MRI.  Possible patient may have had a cardiac dysrhythmia.  Seizure episode is also a possible concern of family did not describe any tonic-clonic activity.  I will consult with the medical service for admission Final Clinical Impression(s) / ED Diagnoses Final diagnoses:  Syncope, unspecified syncope type  Alcoholic intoxication without complication [facility])    Rx / DC Orders ED Discharge Orders     None        Dorie Rank, MD 10/17/21 2051

## 2021-10-17 NOTE — ED Triage Notes (Signed)
Pt from home BIB EMS as a code stroke that was cancelled on arrival. Pt became altered with family and was sat down in chair. He then started to slump to the left side and then became unresponsive. Upon EMS arrival, pt was not responsive to a sternal rub and SBP was in the 60's. Pt  had many apneic episodes requiring BVM. Pt woke up 57min PTA. Hx stroke and ETOH abuse. Pt drowsy and oriented x4. Pt speech is slurred and he reports having 2 beers and 2 shots of whiskey.  CBG 117

## 2021-10-17 NOTE — ED Notes (Signed)
PT given a sandwich bag and some orange juice.

## 2021-10-17 NOTE — ED Notes (Signed)
Pt given orange juice and food to combat low CBG

## 2021-10-17 NOTE — ED Notes (Signed)
Admitting provider aware of CBG and  actions taken to combat low CBG

## 2021-10-18 ENCOUNTER — Observation Stay (HOSPITAL_BASED_OUTPATIENT_CLINIC_OR_DEPARTMENT_OTHER): Payer: Medicare Other

## 2021-10-18 DIAGNOSIS — R55 Syncope and collapse: Secondary | ICD-10-CM

## 2021-10-18 DIAGNOSIS — I10 Essential (primary) hypertension: Secondary | ICD-10-CM | POA: Diagnosis present

## 2021-10-18 DIAGNOSIS — E871 Hypo-osmolality and hyponatremia: Secondary | ICD-10-CM | POA: Diagnosis present

## 2021-10-18 DIAGNOSIS — F1010 Alcohol abuse, uncomplicated: Secondary | ICD-10-CM | POA: Diagnosis present

## 2021-10-18 LAB — TROPONIN I (HIGH SENSITIVITY)
Troponin I (High Sensitivity): 4 ng/L (ref <18)
Troponin I (High Sensitivity): 7 ng/L (ref <18)

## 2021-10-18 LAB — GLUCOSE, CAPILLARY
Glucose-Capillary: 103 mg/dL — ABNORMAL HIGH (ref 70–99)
Glucose-Capillary: 108 mg/dL — ABNORMAL HIGH (ref 70–99)
Glucose-Capillary: 120 mg/dL — ABNORMAL HIGH (ref 70–99)
Glucose-Capillary: 140 mg/dL — ABNORMAL HIGH (ref 70–99)
Glucose-Capillary: 168 mg/dL — ABNORMAL HIGH (ref 70–99)
Glucose-Capillary: 75 mg/dL (ref 70–99)
Glucose-Capillary: 99 mg/dL (ref 70–99)

## 2021-10-18 LAB — COMPREHENSIVE METABOLIC PANEL
ALT: 19 U/L (ref 0–44)
AST: 28 U/L (ref 15–41)
Albumin: 3.1 g/dL — ABNORMAL LOW (ref 3.5–5.0)
Alkaline Phosphatase: 43 U/L (ref 38–126)
Anion gap: 9 (ref 5–15)
BUN: 15 mg/dL (ref 8–23)
CO2: 19 mmol/L — ABNORMAL LOW (ref 22–32)
Calcium: 7.8 mg/dL — ABNORMAL LOW (ref 8.9–10.3)
Chloride: 105 mmol/L (ref 98–111)
Creatinine, Ser: 0.82 mg/dL (ref 0.61–1.24)
GFR, Estimated: >60 mL/min (ref >60)
Glucose, Bld: 151 mg/dL — ABNORMAL HIGH (ref 70–99)
Potassium: 4.2 mmol/L (ref 3.5–5.1)
Sodium: 133 mmol/L — ABNORMAL LOW (ref 135–145)
Total Bilirubin: 1.1 mg/dL (ref 0.3–1.2)
Total Protein: 5.5 g/dL — ABNORMAL LOW (ref 6.5–8.1)

## 2021-10-18 LAB — CBC
HCT: 32.9 % — ABNORMAL LOW (ref 39.0–52.0)
Hemoglobin: 11.4 g/dL — ABNORMAL LOW (ref 13.0–17.0)
MCH: 34.4 pg — ABNORMAL HIGH (ref 26.0–34.0)
MCHC: 34.7 g/dL (ref 30.0–36.0)
MCV: 99.4 fL (ref 80.0–100.0)
Platelets: 111 10*3/uL — ABNORMAL LOW (ref 150–400)
RBC: 3.31 MIL/uL — ABNORMAL LOW (ref 4.22–5.81)
RDW: 12.8 % (ref 11.5–15.5)
WBC: 5.7 10*3/uL (ref 4.0–10.5)
nRBC: 0 % (ref 0.0–0.2)

## 2021-10-18 LAB — ECHOCARDIOGRAM COMPLETE
Area-P 1/2: 4.15 cm2
S' Lateral: 3.1 cm
Weight: 2596.14 oz

## 2021-10-18 LAB — OSMOLALITY: Osmolality: 284 mOsm/kg (ref 275–295)

## 2021-10-18 LAB — PROTIME-INR
INR: 1.1 (ref 0.8–1.2)
Prothrombin Time: 13.7 seconds (ref 11.4–15.2)

## 2021-10-18 LAB — MAGNESIUM
Magnesium: 1.9 mg/dL (ref 1.7–2.4)
Magnesium: 1.9 mg/dL (ref 1.7–2.4)

## 2021-10-18 LAB — PHOSPHORUS: Phosphorus: 3.7 mg/dL (ref 2.5–4.6)

## 2021-10-18 LAB — TSH: TSH: 1.18 u[IU]/mL (ref 0.350–4.500)

## 2021-10-18 LAB — HEMOGLOBIN A1C
Hgb A1c MFr Bld: 4.5 % — ABNORMAL LOW (ref 4.8–5.6)
Mean Plasma Glucose: 82.45 mg/dL

## 2021-10-18 MED ORDER — RAMIPRIL 5 MG PO CAPS
20.0000 mg | ORAL_CAPSULE | Freq: Every day | ORAL | Status: DC
  Administered 2021-10-18: 20 mg via ORAL
  Filled 2021-10-18: qty 4

## 2021-10-18 MED ORDER — SIMVASTATIN 20 MG PO TABS: 20.0000 mg | ORAL_TABLET | Freq: Every day | ORAL | Status: DC

## 2021-10-18 MED ORDER — CARVEDILOL 12.5 MG PO TABS: 12.5000 mg | ORAL_TABLET | Freq: Two times a day (BID) | ORAL | Status: DC

## 2021-10-18 MED ORDER — MULTI-VITAMIN/MINERALS PO TABS: 1.0000 | ORAL_TABLET | Freq: Every day | ORAL | 2 refills | Status: AC

## 2021-10-18 MED ORDER — FLUTICASONE PROPIONATE 50 MCG/ACT NA SUSP
1.0000 | Freq: Every day | NASAL | Status: DC | PRN
  Filled 2021-10-18: qty 16

## 2021-10-18 MED ORDER — ALLOPURINOL 100 MG PO TABS
100.0000 mg | ORAL_TABLET | Freq: Every day | ORAL | Status: DC
  Administered 2021-10-18: 100 mg via ORAL
  Filled 2021-10-18: qty 1

## 2021-10-18 MED ORDER — SERTRALINE HCL 50 MG PO TABS: 50.0000 mg | ORAL_TABLET | Freq: Every day | ORAL | Status: DC | PRN

## 2021-10-18 MED ORDER — DIPHENOXYLATE-ATROPINE 2.5-0.025 MG PO TABS
1.0000 | ORAL_TABLET | Freq: Two times a day (BID) | ORAL | Status: DC | PRN
  Administered 2021-10-18: 1 via ORAL
  Filled 2021-10-18: qty 1

## 2021-10-18 MED ORDER — THIAMINE HCL 100 MG PO TABS: 100.0000 mg | ORAL_TABLET | Freq: Every day | ORAL | 2 refills | Status: DC

## 2021-10-18 NOTE — Progress Notes (Signed)
  Echocardiogram 2D Echocardiogram has been performed.  Johny Chess 10/18/2021, 2:17 PM

## 2021-10-18 NOTE — Progress Notes (Signed)
Discharge instructions reviewed with and all questions answered.

## 2021-10-18 NOTE — Progress Notes (Signed)
CSW acknowledging consult for alcohol abuse. CSW met with patient to discuss. Patient says he's been telling himself for a while now that he needs to quit, and he talked with his family last night and he knows he's gotta make some changes for his health. CSW offered resources but patient said he didn't need them. CSW suggested to patient to find some AA groups specific to veterans as the patient says his biggest trigger is when he can't sleep from his PTSD and that has him taking a couple of shots just to get some sleep. Patient hopeful to get home today, no other needs identified at this time.  Laveda Abbe,  Clinical Social Worker [phone]

## 2021-10-18 NOTE — Care Management Obs Status (Signed)
[facility] NOTIFICATION   Patient Details  Name: Luis Kim MRN: [id number] Date of Birth: 24-Nov-1946   Medicare Observation Status Notification Given:  Yes    Geralynn Ochs, LCSW 10/18/2021, 10:34 AM

## 2021-10-18 NOTE — Progress Notes (Signed)
Patient arrived in the unit at 2345 pm from [facility], stable, A&Ox4, denied of pain, initiated telemetry monitor, and will continue to monitor closely.

## 2021-10-18 NOTE — Evaluation (Signed)
Physical Therapy Evaluation & Discharge Patient Details Name: Luis Kim MRN: [id number] DOB: 1946/09/10 Today's Date: 10/18/2021  History of Present Illness  Pt is a 75 y.o. male who presented 10/17/21 following slumping to his L and becoming unresponsive. Upon arrival, BP was low in 60s. Pt did have alcohol and oxycodone prior to event. CT and MRI of brain were negative. PMH: arthritis, DM, hyperlipidemia, [place] tick fever, CVA   Clinical Impression  Pt presents with condition above. PTA, he was mod I for all functional mobility using a SPC, living with his family in a mobile home with a ramped entrance option. Currently, pt appears to be at his baseline, denying any numbness/tingling throughout and displaying intact bil coordination in all extremities. He does display some weakness in his R hip and knee and L shoulder, which he reports is his baseline due to pain or prior injuries. Pt does display some mild balance deficits and reports hx of falls, but was able to display safe mobility with a SPC without LOB even when negotiating obstacles and stairs this session. All education completed and questions answered. PT will sign off.     Recommendations for follow up therapy are one component of a multi-disciplinary discharge planning process, led by the attending physician.  Recommendations may be updated based on patient status, additional functional criteria and insurance authorization.  Follow Up Recommendations No PT follow up    Assistance Recommended at Discharge PRN  Functional Status Assessment Patient has not had a recent decline in their functional status  Equipment Recommendations  None recommended by PT    Recommendations for Other Services       Precautions / Restrictions Precautions Precautions: Fall Restrictions Weight Bearing Restrictions: No      Mobility  Bed Mobility Overal bed mobility: Needs Assistance Bed Mobility: Supine to Sit     Supine to  sit: Supervision;HOB elevated     General bed mobility comments: Extra time and effort using bed rail with HOB elevated to ascend trunk to sit R EOB, supervision for safety.    Transfers Overall transfer level: Needs assistance Equipment used: Straight cane Transfers: Sit to/from Stand Sit to Stand: Min guard           General transfer comment: Min guard for safety, no LOB.    Ambulation/Gait Ambulation/Gait assistance: Min guard Gait Distance (Feet): 320 Feet Assistive device: Straight cane Gait Pattern/deviations: Step-through pattern;Decreased stride length;Wide base of support;Trunk flexed Gait velocity: reduced Gait velocity interpretation: 1.31 - 2.62 ft/sec, indicative of limited community ambulator   General Gait Details: Pt with slow, but steady gait using SPC in R hand lightly. No LOB, even when cued to change head positions, negotiate obstacles, or change directions. Min gaurd for safety.  Stairs Stairs: Yes Stairs assistance: Min guard Stair Management: One rail Left;Step to pattern;Forwards;With cane Number of Stairs: 2 General stair comments: Ascends and descends with step-to pattern and use of handrail, no LOB, slow but steady, min guard for safety.  Wheelchair Mobility    Modified Rankin (Stroke Patients Only) Modified Rankin (Stroke Patients Only) Pre-Morbid Rankin Score: Slight disability Modified Rankin: Moderately severe disability     Balance Overall balance assessment: Mild deficits observed, not formally tested                                           Pertinent Vitals/Pain Pain Assessment:  0-10 Pain Score: 5  Pain Location: R arm Pain Descriptors / Indicators: Discomfort Pain Intervention(s): Limited activity within patient's tolerance;Monitored during session;Repositioned    Home Living Family/patient expects to be discharged to:: Private residence Living Arrangements: Spouse/significant other;Children;Other  relatives (grandchildren) Available Help at Discharge: Family;Available 24 hours/day Type of Home: Mobile home Home Access: Stairs to enter;Ramped entrance Entrance Stairs-Rails: Left;Right Entrance Stairs-Number of Steps: 4 (ramp entrance)   Home Layout: One level Home Equipment: Cane - single Barista (2 wheels);Shower seat;BSC/3in1;Grab bars - tub/shower      Prior Function Prior Level of Function : Independent/Modified Independent;Driving;History of Falls (last six months)             Mobility Comments: Uses SPC. x4 falls in past 6 months. Likes to do yard work. Lightheadedness began to occur ~1 year prior, mainly occurs when he stops moving.       Hand Dominance   Dominant Hand: Right    Extremity/Trunk Assessment   Upper Extremity Assessment Upper Extremity Assessment: RUE deficits/detail;LUE deficits/detail RUE Deficits / Details: MMT scores of 4+ shoulder flexion, 5 elbow flexion, 4 elbow extension (pain limiting), good grip; denies numbness/tingling; coordination intact RUE Sensation: WNL RUE Coordination: WNL LUE Deficits / Details: MMT scores of 4 shoulder flexion (limited AROM compared to R which pt reports is baseline), 5 elbow flexion, 4+ elbow extension, good grip; denies numbness/tingling; coordination intact LUE Sensation: WNL LUE Coordination: WNL    Lower Extremity Assessment Lower Extremity Assessment: RLE deficits/detail;LLE deficits/detail RLE Deficits / Details: MMT scores of 4 hip flexion, 4+ knee extension, 4+ ankle dorsiflexion; hx of R knee pain limiting strength; denies numbness/tingling; coordination intact RLE Sensation: WNL RLE Coordination: WNL LLE Deficits / Details: MMT scores of 4+ hip flexion, 5 knee extension, 4+ ankle dorsiflexion; denies numbness/tingling; coordination intact LLE Sensation: WNL LLE Coordination: WNL    Cervical / Trunk Assessment Cervical / Trunk Assessment: Kyphotic  Communication   Communication:  No difficulties  Cognition Arousal/Alertness: Awake/alert Behavior During Therapy: WFL for tasks assessed/performed Overall Cognitive Status: Within Functional Limits for tasks assessed                                 General Comments: Follows commands appropriately and able to path find his way back to his room without cues.        General Comments General comments (skin integrity, edema, etc.): BP: 169/70 supine, 171/87 sitting, 181/80 standing; denied lightheadedness/dizziness throughout    Exercises     Assessment/Plan    PT Assessment Patient does not need any further PT services  PT Problem List         PT Treatment Interventions      PT Goals (Current goals can be found in the Care Plan section)  Acute Rehab PT Goals Patient Stated Goal: to go home PT Goal Formulation: All assessment and education complete, DC therapy Time For Goal Achievement: 10/19/21 Potential to Achieve Goals: Good    Frequency     Barriers to discharge        Co-evaluation               AM-PAC PT "6 Clicks" Mobility  Outcome Measure Help needed turning from your back to your side while in a flat bed without using bedrails?: A Little Help needed moving from lying on your back to sitting on the side of a flat bed without using bedrails?: A Little Help  needed moving to and from a bed to a chair (including a wheelchair)?: A Little Help needed standing up from a chair using your arms (e.g., wheelchair or bedside chair)?: A Little Help needed to walk in hospital room?: A Little Help needed climbing 3-5 steps with a railing? : A Little 6 Click Score: 18    End of Session Equipment Utilized During Treatment: Gait belt Activity Tolerance: Patient tolerated treatment well Patient left: in chair;with call bell/phone within reach;with chair alarm set Nurse Communication: Mobility status PT Visit Diagnosis: Unsteadiness on feet (R26.81);Other abnormalities of gait and mobility  (R26.89);Muscle weakness (generalized) (M62.81);History of falling (Z91.81);Difficulty in walking, not elsewhere classified (R26.2)    Time: 2767-0110 PT Time Calculation (min) (ACUTE ONLY): 35 min   Charges:   PT Evaluation $PT Eval Low Complexity: 1 Low PT Treatments $Gait Training: 8-22 mins        Moishe Spice, PT, DPT Acute Rehabilitation Services  Pager: [phone] Office: [phone]   Orvan Falconer 10/18/2021, 8:33 AM

## 2021-10-18 NOTE — Plan of Care (Signed)
  Problem: Education: Goal: Knowledge of disease or condition will improve Outcome: Progressing Goal: Knowledge of secondary prevention will improve (SELECT ALL) Outcome: Progressing Goal: Knowledge of patient specific risk factors will improve (INDIVIDUALIZE FOR PATIENT) Outcome: Progressing   Problem: Coping: Goal: Will verbalize positive feelings about self Outcome: Progressing Goal: Will identify appropriate support needs Outcome: Progressing   Problem: Health Behavior/Discharge Planning: Goal: Ability to manage health-related needs will improve Outcome: Progressing   Problem: Self-Care: Goal: Ability to participate in self-care as condition permits will improve Outcome: Progressing Goal: Verbalization of feelings and concerns over difficulty with self-care will improve Outcome: Progressing Goal: Ability to communicate needs accurately will improve Outcome: Progressing   Problem: Nutrition: Goal: Risk of aspiration will decrease Outcome: Progressing Goal: Dietary intake will improve Outcome: Progressing   Problem: Intracerebral Hemorrhage Tissue Perfusion: Goal: Complications of Intracerebral Hemorrhage will be minimized Outcome: Progressing   Problem: Ischemic Stroke/TIA Tissue Perfusion: Goal: Complications of ischemic stroke/TIA will be minimized Outcome: Progressing   Problem: Spontaneous Subarachnoid Hemorrhage Tissue Perfusion: Goal: Complications of Spontaneous Subarachnoid Hemorrhage will be minimized Outcome: Progressing

## 2021-10-18 NOTE — Discharge Summary (Signed)
[facility]  Physician Discharge Summary   Patient ID: Luis Kim MRN: [id number] DOB/AGE: 76-Aug-1947 76 y.o.  Admit date: 10/17/2021 Discharge date: 10/18/2021    PCP: Leonides Sake, MD  DISCHARGE DIAGNOSES:  Syncope likely a result of mixing alcohol with opioids Chronic alcohol abuse Hyperlipidemia Essential hypertension Hyponatremia   RECOMMENDATIONS FOR OUTPATIENT FOLLOW UP: Patient instructed to not consume alcohol when he takes pain medications. Follow-up with PCP in 1 week   Home Health: None Equipment/Devices: None  CODE STATUS: Full code  DISCHARGE CONDITION: fair  Diet recommendation: As before  INITIAL HISTORY: Luis Kim is a 75 y.o. male with medical history significant for type 2 diabetes mellitus 10 minutes for list of medications, chronic alcohol abuse, hypertension, hyperlipidemia who is admitted to [facility] on 10/17/2021 with suspected syncope after presenting from home to [facility] ED for evaluation of an episode diminished responsiveness.    Around 1700 on 10/17/2021, patient was at home, sitting on his couch watching television, he was noted will exhibit evidence of diminished responsiveness, prompting family to contact EMS.  No reported associated tonic-clonic activity, nor any tongue biting, nor any evidence of loss of bowel/bladder function.  Patient does not recall the events specifically to his diminished responsiveness episode.  Denies any recent chest pain, shortness of breath, dizziness, diaphoresis, dizziness, or presyncope.  No recent preceding trauma.  No history of seizure activity.  He confirms a history of chronic alcohol abuse, which he consumes at least a sixpack of beer on a daily basis and has been consuming alcohol at this appointment frequency for several years now.  He knowledges a history of hypertension, with notable outpatient medications include Norvasc, Coreg, will.  Denies any associated acute focal  weakness, paresthesias, numbness, dysarthria, acute change in vision, facial droop, dysphagia, or vertigo.     Consultations: None  Procedures: Transthoracic echocardiogram  HOSPITAL COURSE:   Patient presented to the hospital with symptoms suggestive of a syncopal episode.  He admitted to taking his narcotic pain medication and then consuming multiple alcoholic beverages within few hours.  Patient apparently passed out while he was sitting.  Orthostatics were unremarkable.  Patient was given IV fluids.   Initially there was concern for stroke and code stroke was called.  However MRI did not show any stroke.  Patient did not have any focal neurological deficits.  Echocardiogram does not show any concerning findings.  Telemetry did not show any arrhythmias.  EKG was also unremarkable.   Presentation was most likely due to mixing alcohol with narcotic medications.  He was counseled not to do this again.    His other medical issues are overall stable.  Noted to have normocytic anemia without any overt bleeding.  Sodium level was mildly low.  These issues can be followed by his primary care provider.    TSH normal at 1.18.  HbA1c 4.5.  He may continue his other home medications.  His daughter was also updated with his permission.  Overall stable.  Seen by physical therapy.  Okay for discharge home today.       PERTINENT LABS:  The results of significant diagnostics from this hospitalization (including imaging, microbiology, ancillary and laboratory) are listed below for reference.    Microbiology: Recent Results (from the past 240 hour(s))  Resp Panel by RT-PCR (Flu A&B, Covid) Nasopharyngeal Swab     Status: None   Collection Time: 10/17/21  9:17 PM   Specimen: Nasopharyngeal Swab; Nasopharyngeal(NP) swabs in vial transport  medium  Result Value Ref Range Status   SARS Coronavirus 2 by RT PCR NEGATIVE NEGATIVE Final    Comment: (NOTE) SARS-CoV-2 target nucleic acids are NOT  DETECTED.  The SARS-CoV-2 RNA is generally detectable in upper respiratory specimens during the acute phase of infection. The lowest concentration of SARS-CoV-2 viral copies this assay can detect is 138 copies/mL. A negative result does not preclude SARS-Cov-2 infection and should not be used as the sole basis for treatment or other patient management decisions. A negative result may occur with  improper specimen collection/handling, submission of specimen other than nasopharyngeal swab, presence of viral mutation(s) within the areas targeted by this assay, and inadequate number of viral copies(<138 copies/mL). A negative result must be combined with clinical observations, patient history, and epidemiological information. The expected result is Negative.  Fact Sheet for Patients:  EntrepreneurPulse.com.au  Fact Sheet for Healthcare Providers:  IncredibleEmployment.be  This test is no t yet approved or cleared by the [place] FDA and  has been authorized for detection and/or diagnosis of SARS-CoV-2 by FDA under an Emergency Use Authorization (EUA). This EUA will remain  in effect (meaning this test can be used) for the duration of the COVID-19 declaration under Section 564(b)(1) of the Act, 21 U.S.C.section 360bbb-3(b)(1), unless the authorization is terminated  or revoked sooner.       Influenza A by PCR NEGATIVE NEGATIVE Final   Influenza B by PCR NEGATIVE NEGATIVE Final    Comment: (NOTE) The Xpert Xpress SARS-CoV-2/FLU/RSV plus assay is intended as an aid in the diagnosis of influenza from Nasopharyngeal swab specimens and should not be used as a sole basis for treatment. Nasal washings and aspirates are unacceptable for Xpert Xpress SARS-CoV-2/FLU/RSV testing.  Fact Sheet for Patients: EntrepreneurPulse.com.au  Fact Sheet for Healthcare Providers: IncredibleEmployment.be  This test is not yet  approved or cleared by the [place] FDA and has been authorized for detection and/or diagnosis of SARS-CoV-2 by FDA under an Emergency Use Authorization (EUA). This EUA will remain in effect (meaning this test can be used) for the duration of the COVID-19 declaration under Section 564(b)(1) of the Act, 21 U.S.C. section 360bbb-3(b)(1), unless the authorization is terminated or revoked.  Performed at [facility], [place] [address], [place], [place] [address]      Labs:  COVID-19 Labs   Lab Results  Component Value Date   [place] NEGATIVE 10/17/2021      Basic Metabolic Panel: Recent Labs  Lab 10/17/21 1826 10/17/21 2327 10/18/21 0050  NA 132*  135  --  133*  K 4.2  4.3  --  4.2  CL 105  104  --  105  CO2 17*  --  19*  GLUCOSE 93  92  --  151*  BUN 16  19  --  15  CREATININE 1.03  1.20  --  0.82  CALCIUM 7.7*  --  7.8*  MG  --  1.9 1.9  PHOS  --   --  3.7   Liver Function Tests: Recent Labs  Lab 10/17/21 1826 10/18/21 0050  AST 25 28  ALT 17 19  ALKPHOS 39 43  BILITOT 1.0 1.1  PROT 5.5* 5.5*  ALBUMIN 3.0* 3.1*    CBC: Recent Labs  Lab 10/17/21 1826 10/18/21 0050  WBC 8.2 5.7  NEUTROABS 5.2  --   HGB 11.3*  11.2* 11.4*  HCT 32.9*  33.0* 32.9*  MCV 102.5* 99.4  PLT 125* 111*     CBG:  Recent Labs  Lab 10/18/21 0434 10/18/21 0628 10/18/21 0812 10/18/21 1106 10/18/21 1205  GLUCAP 108* 75 120* 103* 99     IMAGING STUDIES CT HEAD WO CONTRAST (5MM)  Result Date: 10/17/2021 CLINICAL DATA:  Neuro deficit, acute, stroke suspected EXAM: CT HEAD WITHOUT CONTRAST TECHNIQUE: Contiguous axial images were obtained from the base of the skull through the vertex without intravenous contrast. COMPARISON:  None. FINDINGS: Brain: No evidence of large territory acute infarction, hemorrhage, hydrocephalus, extra-axial collection or mass lesion/mass effect. Patchy intermediate-low density changes within the bilateral basal ganglia. Mild  age related cerebral volume loss. Vascular: Atherosclerotic calcifications involving the large vessels of the skull base. No unexpected hyperdense vessel. Skull: Normal. Negative for fracture or focal lesion. Sinuses/Orbits: No acute finding. Other: None. IMPRESSION: 1. Patchy intermediate-low density changes within the bilateral basal ganglia, which may represent microvascular ischemic change versus age-indeterminate lacunar infarcts. Further evaluation with MRI is recommended. 2. No evidence of large territory infarction. 3. Mild age related cerebral volume loss. Electronically Signed   By: Davina Poke D.O.   On: 10/17/2021 19:28   MR BRAIN WO CONTRAST  Result Date: 10/17/2021 CLINICAL DATA:  Altered mental status, slumping to left-side, unresponsive EXAM: MRI HEAD WITHOUT CONTRAST TECHNIQUE: Multiplanar, multiecho pulse sequences of the brain and surrounding structures were obtained without intravenous contrast. COMPARISON:  No prior MRI, correlation is made with CT head 10/17/2021 FINDINGS: Brain: No restricted diffusion to suggest acute or subacute infarct. No acute hemorrhage, mass, mass effect, or midline shift. No foci of hemosiderin deposition to suggest remote hemorrhage. Mild T2 hyperintense signal in the periventricular white matter, likely the sequela of chronic small vessel ischemic disease. No extra-axial collection or hydrocephalus. Lacunar infarcts in the bilateral basal ganglia. Vascular: Normal flow voids. Skull and upper cervical spine: Normal marrow signal. Degenerative changes at C3-C4. Sinuses/Orbits: Negative. Other: The mastoids are well aerated. IMPRESSION: No acute intracranial process. Electronically Signed   By: Merilyn Baba M.D.   On: 10/17/2021 22:17   DG Chest Portable 1 View  Result Date: 10/17/2021 CLINICAL DATA:  Syncope EXAM: PORTABLE CHEST 1 VIEW COMPARISON:  04/24/2014 FINDINGS: The heart size and mediastinal contours are within normal limits. Both lungs are  clear. The visualized skeletal structures are unremarkable. IMPRESSION: No active disease. Electronically Signed   By: Donavan Foil M.D.   On: 10/17/2021 20:21   ECHOCARDIOGRAM COMPLETE  Result Date: 10/18/2021    ECHOCARDIOGRAM REPORT   Patient Name:   Luis Kim [facility] Date of Exam: 10/18/2021 Medical Rec #:  [id number]         Height:       66.0 in Accession #:    [id number]        Weight:       162.3 lb Date of Birth:  1946/04/01          BSA:          1.830 m Patient Age:    55 years          BP:           173/74 mmHg Patient Gender: M                 HR:           69 bpm. Exam Location:  Inpatient Procedure: 2D Echo Indications:    syncope  History:        Patient has prior history of Echocardiogram examinations, most  recent 06/25/2011. Risk Factors:Hypertension and Dyslipidemia.  Sonographer:    Johny Chess RDCS Referring Phys: [id number] [place]  1. Left ventricular ejection fraction, by estimation, is 60 to 65%. The left ventricle has normal function. The left ventricle has no regional wall motion abnormalities. Left ventricular diastolic parameters are indeterminate.  2. Right ventricular systolic function is normal. The right ventricular size is normal. Tricuspid regurgitation signal is inadequate for assessing PA pressure.  3. Left atrial size was moderately dilated.  4. The mitral valve is degenerative. Trivial mitral valve regurgitation. No evidence of mitral stenosis.  5. The aortic valve is grossly normal. Aortic valve regurgitation is not visualized. No aortic stenosis is present.  6. The inferior vena cava is normal in size with greater than 50% respiratory variability, suggesting right atrial pressure of 3 mmHg. FINDINGS  Left Ventricle: Left ventricular ejection fraction, by estimation, is 60 to 65%. The left ventricle has normal function. The left ventricle has no regional wall motion abnormalities. The left ventricular internal cavity size was normal in  size. There is  no left ventricular hypertrophy. Left ventricular diastolic parameters are indeterminate. Right Ventricle: The right ventricular size is normal. No increase in right ventricular wall thickness. Right ventricular systolic function is normal. Tricuspid regurgitation signal is inadequate for assessing PA pressure. Left Atrium: Left atrial size was moderately dilated. Right Atrium: Right atrial size was normal in size. Pericardium: Trivial pericardial effusion is present. Presence of epicardial fat layer. Mitral Valve: The mitral valve is degenerative in appearance. There is mild calcification of the anterior mitral valve leaflet(s). Trivial mitral valve regurgitation. No evidence of mitral valve stenosis. Tricuspid Valve: The tricuspid valve is grossly normal. Tricuspid valve regurgitation is not demonstrated. No evidence of tricuspid stenosis. Aortic Valve: The aortic valve is grossly normal. Aortic valve regurgitation is not visualized. No aortic stenosis is present. Pulmonic Valve: The pulmonic valve was grossly normal. Pulmonic valve regurgitation is not visualized. No evidence of pulmonic stenosis. Aorta: The aortic root and ascending aorta are structurally normal, with no evidence of dilitation. Venous: The right lower pulmonary vein is normal. The inferior vena cava is normal in size with greater than 50% respiratory variability, suggesting right atrial pressure of 3 mmHg. IAS/Shunts: The atrial septum is grossly normal.  LEFT VENTRICLE PLAX 2D LVIDd:         4.80 cm   Diastology LVIDs:         3.10 cm   LV e' medial:    7.40 cm/s LV PW:         1.00 cm   LV E/e' medial:  15.1 LV IVS:        1.00 cm   LV e' lateral:   7.07 cm/s LVOT diam:     2.10 cm   LV E/e' lateral: 15.8 LV SV:         79 LV SV Index:   43 LVOT Area:     3.46 cm  RIGHT VENTRICLE             IVC RV S prime:     11.60 cm/s  IVC diam: 2.30 cm TAPSE (M-mode): 2.7 cm LEFT ATRIUM             Index        RIGHT ATRIUM            Index LA diam:        3.80 cm 2.08 cm/m   RA Area:     14.80  cm LA Vol (A2C):   86.8 ml 47.44 ml/m  RA Volume:   34.40 ml  18.80 ml/m LA Vol (A4C):   77.6 ml 42.41 ml/m LA Biplane Vol: 82.5 ml 45.09 ml/m  AORTIC VALVE LVOT Vmax:   113.00 cm/s LVOT Vmean:  71.700 cm/s LVOT VTI:    0.227 m  AORTA Ao Root diam: 3.30 cm Ao Asc diam:  3.50 cm MITRAL VALVE MV Area (PHT): 4.15 cm     SHUNTS MV Decel Time: 183 msec     Systemic VTI:  0.23 m MV E velocity: 112.00 cm/s  Systemic Diam: 2.10 cm MV A velocity: 116.00 cm/s MV E/A ratio:  0.97 Eleonore Chiquito MD Electronically signed by Eleonore Chiquito MD Signature Date/Time: 10/18/2021/3:36:33 PM    Final     DISCHARGE EXAMINATION: Vitals:   10/18/21 0500 10/18/21 0724 10/18/21 1124 10/18/21 1600  BP:  (!) 159/82 (!) 173/74 (!) 156/72  Pulse:  76 69 72  Resp:  (!) 21 20 18   Temp:  99 F (37.2 C) 98.3 F (36.8 C) 98.7 F (37.1 C)  TempSrc:  Oral Oral Oral  SpO2:  97% 97% 98%  Weight: 73.6 kg      General appearance: Awake alert.  In no distress Resp: Clear to auscultation bilaterally.  Normal effort Cardio: S1-S2 is normal regular.  No S3-S4.  No rubs murmurs or bruit GI: Abdomen is soft.  Nontender nondistended.  Bowel sounds are present normal.  No masses organomegaly Extremities: No edema.  Full range of motion of lower extremities. Neurologic: Alert and oriented x3.  No focal neurological deficits.    DISPOSITION: Home  Discharge Instructions     Call MD for:  difficulty breathing, headache or visual disturbances   Complete by: As directed    Call MD for:  extreme fatigue   Complete by: As directed    Call MD for:  persistant dizziness or light-headedness   Complete by: As directed    Call MD for:  persistant nausea and vomiting   Complete by: As directed    Call MD for:  severe uncontrolled pain   Complete by: As directed    Call MD for:  temperature >100.4   Complete by: As directed    Diet - low sodium heart healthy   Complete by:  As directed    Discharge instructions   Complete by: As directed    Please do not consume any alcoholic beverage when you take pain medications.  Taking these 2 agents together will result in multiple side effects which can be life-threatening.  Seek attention if symptoms recur.  You were cared for by a hospitalist during your hospital stay. If you have any questions about your discharge medications or the care you received while you were in the hospital after you are discharged, you can call the unit and asked to speak with the hospitalist on call if the hospitalist that took care of you is not available. Once you are discharged, your primary care physician will handle any further medical issues. Please note that NO REFILLS for any discharge medications will be authorized once you are discharged, as it is imperative that you return to your primary care physician (or establish a relationship with a primary care physician if you do not have one) for your aftercare needs so that they can reassess your need for medications and monitor your lab values. If you do not have a primary care physician, you can call [phone] for a physician  referral.   Increase activity slowly   Complete by: As directed           Allergies as of 10/18/2021   No Known Allergies      Medication List     TAKE these medications    allopurinol 100 MG tablet Commonly known as: ZYLOPRIM Take 100 mg by mouth daily.   carvedilol 12.5 MG tablet Commonly known as: COREG Take 12.5 mg by mouth 2 (two) times daily with a meal.   diphenoxylate-atropine 2.5-0.025 MG tablet Commonly known as: LOMOTIL Take 1 tablet by mouth 2 (two) times daily as needed.   fluticasone 50 MCG/ACT nasal spray Commonly known as: FLONASE Place 1 spray into both nostrils daily as needed for allergies or rhinitis.   furosemide 20 MG tablet Commonly known as: LASIX Take 20 mg by mouth daily.   multivitamin with minerals tablet Take 1  tablet by mouth daily.   oxyCODONE-acetaminophen 5-325 MG tablet Commonly known as: PERCOCET/ROXICET Take 1 tablet by mouth 3 (three) times daily as needed for severe pain. What changed: Another medication with the same name was removed. Continue taking this medication, and follow the directions you see here.   ramipril 10 MG capsule Commonly known as: ALTACE Take 20 mg by mouth daily.   sertraline 50 MG tablet Commonly known as: ZOLOFT Take 50 mg by mouth daily as needed (depression).   simvastatin 20 MG tablet Commonly known as: ZOCOR Take 20 mg by mouth at bedtime.   tetrahydrozoline 0.05 % ophthalmic solution Place 1 drop into both eyes daily as needed (for irritation).   thiamine 100 MG tablet Take 1 tablet (100 mg total) by mouth daily.          Follow-up Information     Hamrick, Maura L, MD Follow up in 1 week(s).   Specialty: Family Medicine Contact information: [place] [address] [phone]                 TOTAL DISCHARGE TIME: 35 minutes  [facility] Pager on [email or web address]  10/19/2021, 11:40 AM

## 2021-10-19 LAB — PROLACTIN: Prolactin: 8.9 ng/mL (ref 4.0–15.2)

## 2021-10-26 DIAGNOSIS — H2513 Age-related nuclear cataract, bilateral: Secondary | ICD-10-CM | POA: Diagnosis not present

## 2021-10-26 DIAGNOSIS — H5203 Hypermetropia, bilateral: Secondary | ICD-10-CM | POA: Diagnosis not present

## 2021-10-26 DIAGNOSIS — H52221 Regular astigmatism, right eye: Secondary | ICD-10-CM | POA: Diagnosis not present

## 2021-12-13 DIAGNOSIS — M25561 Pain in right knee: Secondary | ICD-10-CM | POA: Diagnosis not present

## 2021-12-13 DIAGNOSIS — G894 Chronic pain syndrome: Secondary | ICD-10-CM | POA: Diagnosis not present

## 2021-12-13 DIAGNOSIS — Z79899 Other long term (current) drug therapy: Secondary | ICD-10-CM | POA: Diagnosis not present

## 2021-12-13 DIAGNOSIS — Z1389 Encounter for screening for other disorder: Secondary | ICD-10-CM | POA: Diagnosis not present

## 2021-12-19 DIAGNOSIS — M109 Gout, unspecified: Secondary | ICD-10-CM | POA: Diagnosis not present

## 2021-12-19 DIAGNOSIS — I10 Essential (primary) hypertension: Secondary | ICD-10-CM | POA: Diagnosis not present

## 2021-12-19 DIAGNOSIS — F32A Depression, unspecified: Secondary | ICD-10-CM | POA: Diagnosis not present

## 2021-12-19 DIAGNOSIS — Z6825 Body mass index (BMI) 25.0-25.9, adult: Secondary | ICD-10-CM | POA: Diagnosis not present

## 2021-12-19 DIAGNOSIS — Z125 Encounter for screening for malignant neoplasm of prostate: Secondary | ICD-10-CM | POA: Diagnosis not present

## 2021-12-19 DIAGNOSIS — R600 Localized edema: Secondary | ICD-10-CM | POA: Diagnosis not present

## 2021-12-19 DIAGNOSIS — M179 Osteoarthritis of knee, unspecified: Secondary | ICD-10-CM | POA: Diagnosis not present

## 2021-12-19 DIAGNOSIS — E7800 Pure hypercholesterolemia, unspecified: Secondary | ICD-10-CM | POA: Diagnosis not present

## 2021-12-19 DIAGNOSIS — K580 Irritable bowel syndrome with diarrhea: Secondary | ICD-10-CM | POA: Diagnosis not present

## 2022-01-03 DIAGNOSIS — Z79899 Other long term (current) drug therapy: Secondary | ICD-10-CM | POA: Diagnosis not present

## 2022-01-03 DIAGNOSIS — Z79891 Long term (current) use of opiate analgesic: Secondary | ICD-10-CM | POA: Diagnosis not present

## 2022-01-03 DIAGNOSIS — M25561 Pain in right knee: Secondary | ICD-10-CM | POA: Diagnosis not present

## 2022-01-03 DIAGNOSIS — Z1389 Encounter for screening for other disorder: Secondary | ICD-10-CM | POA: Diagnosis not present

## 2022-01-03 DIAGNOSIS — G894 Chronic pain syndrome: Secondary | ICD-10-CM | POA: Diagnosis not present

## 2022-01-31 DIAGNOSIS — Z79891 Long term (current) use of opiate analgesic: Secondary | ICD-10-CM | POA: Diagnosis not present

## 2022-01-31 DIAGNOSIS — Z1389 Encounter for screening for other disorder: Secondary | ICD-10-CM | POA: Diagnosis not present

## 2022-01-31 DIAGNOSIS — Z79899 Other long term (current) drug therapy: Secondary | ICD-10-CM | POA: Diagnosis not present

## 2022-01-31 DIAGNOSIS — G894 Chronic pain syndrome: Secondary | ICD-10-CM | POA: Diagnosis not present

## 2022-01-31 DIAGNOSIS — M25561 Pain in right knee: Secondary | ICD-10-CM | POA: Diagnosis not present

## 2022-02-18 DIAGNOSIS — Z9181 History of falling: Secondary | ICD-10-CM | POA: Diagnosis not present

## 2022-02-18 DIAGNOSIS — Z0000 Encounter for general adult medical examination without abnormal findings: Secondary | ICD-10-CM | POA: Diagnosis not present

## 2022-02-18 DIAGNOSIS — Z1331 Encounter for screening for depression: Secondary | ICD-10-CM | POA: Diagnosis not present

## 2022-02-18 DIAGNOSIS — E785 Hyperlipidemia, unspecified: Secondary | ICD-10-CM | POA: Diagnosis not present

## 2022-02-28 DIAGNOSIS — Z79891 Long term (current) use of opiate analgesic: Secondary | ICD-10-CM | POA: Diagnosis not present

## 2022-02-28 DIAGNOSIS — G894 Chronic pain syndrome: Secondary | ICD-10-CM | POA: Diagnosis not present

## 2022-02-28 DIAGNOSIS — M25561 Pain in right knee: Secondary | ICD-10-CM | POA: Diagnosis not present

## 2022-03-28 DIAGNOSIS — M25561 Pain in right knee: Secondary | ICD-10-CM | POA: Diagnosis not present

## 2022-03-28 DIAGNOSIS — Z79891 Long term (current) use of opiate analgesic: Secondary | ICD-10-CM | POA: Diagnosis not present

## 2022-03-28 DIAGNOSIS — G894 Chronic pain syndrome: Secondary | ICD-10-CM | POA: Diagnosis not present

## 2022-04-25 DIAGNOSIS — G894 Chronic pain syndrome: Secondary | ICD-10-CM | POA: Diagnosis not present

## 2022-04-25 DIAGNOSIS — Z79891 Long term (current) use of opiate analgesic: Secondary | ICD-10-CM | POA: Diagnosis not present

## 2022-04-25 DIAGNOSIS — Z1389 Encounter for screening for other disorder: Secondary | ICD-10-CM | POA: Diagnosis not present

## 2022-04-25 DIAGNOSIS — M25561 Pain in right knee: Secondary | ICD-10-CM | POA: Diagnosis not present

## 2022-05-23 DIAGNOSIS — Z79891 Long term (current) use of opiate analgesic: Secondary | ICD-10-CM | POA: Diagnosis not present

## 2022-05-23 DIAGNOSIS — G894 Chronic pain syndrome: Secondary | ICD-10-CM | POA: Diagnosis not present

## 2022-05-23 DIAGNOSIS — M25561 Pain in right knee: Secondary | ICD-10-CM | POA: Diagnosis not present

## 2022-07-04 DIAGNOSIS — Z79891 Long term (current) use of opiate analgesic: Secondary | ICD-10-CM | POA: Diagnosis not present

## 2022-07-04 DIAGNOSIS — M25561 Pain in right knee: Secondary | ICD-10-CM | POA: Diagnosis not present

## 2022-07-04 DIAGNOSIS — Z1389 Encounter for screening for other disorder: Secondary | ICD-10-CM | POA: Diagnosis not present

## 2022-07-04 DIAGNOSIS — G894 Chronic pain syndrome: Secondary | ICD-10-CM | POA: Diagnosis not present

## 2022-07-19 DIAGNOSIS — E7800 Pure hypercholesterolemia, unspecified: Secondary | ICD-10-CM | POA: Diagnosis not present

## 2022-07-19 DIAGNOSIS — F325 Major depressive disorder, single episode, in full remission: Secondary | ICD-10-CM | POA: Diagnosis not present

## 2022-07-19 DIAGNOSIS — Z139 Encounter for screening, unspecified: Secondary | ICD-10-CM | POA: Diagnosis not present

## 2022-07-19 DIAGNOSIS — R600 Localized edema: Secondary | ICD-10-CM | POA: Diagnosis not present

## 2022-07-19 DIAGNOSIS — M179 Osteoarthritis of knee, unspecified: Secondary | ICD-10-CM | POA: Diagnosis not present

## 2022-07-19 DIAGNOSIS — I10 Essential (primary) hypertension: Secondary | ICD-10-CM | POA: Diagnosis not present

## 2022-07-19 DIAGNOSIS — K580 Irritable bowel syndrome with diarrhea: Secondary | ICD-10-CM | POA: Diagnosis not present

## 2022-07-19 DIAGNOSIS — M109 Gout, unspecified: Secondary | ICD-10-CM | POA: Diagnosis not present

## 2022-08-01 DIAGNOSIS — Z79891 Long term (current) use of opiate analgesic: Secondary | ICD-10-CM | POA: Diagnosis not present

## 2022-08-01 DIAGNOSIS — M1711 Unilateral primary osteoarthritis, right knee: Secondary | ICD-10-CM | POA: Diagnosis not present

## 2022-08-01 DIAGNOSIS — G894 Chronic pain syndrome: Secondary | ICD-10-CM | POA: Diagnosis not present

## 2022-08-01 DIAGNOSIS — M25561 Pain in right knee: Secondary | ICD-10-CM | POA: Diagnosis not present

## 2022-08-01 DIAGNOSIS — Z1389 Encounter for screening for other disorder: Secondary | ICD-10-CM | POA: Diagnosis not present

## 2022-08-22 DIAGNOSIS — Z79891 Long term (current) use of opiate analgesic: Secondary | ICD-10-CM | POA: Diagnosis not present

## 2022-08-22 DIAGNOSIS — M1711 Unilateral primary osteoarthritis, right knee: Secondary | ICD-10-CM | POA: Diagnosis not present

## 2022-08-22 DIAGNOSIS — M25561 Pain in right knee: Secondary | ICD-10-CM | POA: Diagnosis not present

## 2022-08-22 DIAGNOSIS — G894 Chronic pain syndrome: Secondary | ICD-10-CM | POA: Diagnosis not present

## 2022-09-19 DIAGNOSIS — Z79891 Long term (current) use of opiate analgesic: Secondary | ICD-10-CM | POA: Diagnosis not present

## 2022-09-19 DIAGNOSIS — M25561 Pain in right knee: Secondary | ICD-10-CM | POA: Diagnosis not present

## 2022-09-19 DIAGNOSIS — G894 Chronic pain syndrome: Secondary | ICD-10-CM | POA: Diagnosis not present

## 2022-09-19 DIAGNOSIS — M1711 Unilateral primary osteoarthritis, right knee: Secondary | ICD-10-CM | POA: Diagnosis not present

## 2022-10-24 DIAGNOSIS — M25561 Pain in right knee: Secondary | ICD-10-CM | POA: Diagnosis not present

## 2022-10-24 DIAGNOSIS — M1711 Unilateral primary osteoarthritis, right knee: Secondary | ICD-10-CM | POA: Diagnosis not present

## 2022-10-24 DIAGNOSIS — G894 Chronic pain syndrome: Secondary | ICD-10-CM | POA: Diagnosis not present

## 2022-10-24 DIAGNOSIS — Z79891 Long term (current) use of opiate analgesic: Secondary | ICD-10-CM | POA: Diagnosis not present

## 2022-11-07 DIAGNOSIS — G894 Chronic pain syndrome: Secondary | ICD-10-CM | POA: Diagnosis not present

## 2022-11-07 DIAGNOSIS — M25561 Pain in right knee: Secondary | ICD-10-CM | POA: Diagnosis not present

## 2022-11-07 DIAGNOSIS — M1711 Unilateral primary osteoarthritis, right knee: Secondary | ICD-10-CM | POA: Diagnosis not present

## 2022-11-07 DIAGNOSIS — Z79891 Long term (current) use of opiate analgesic: Secondary | ICD-10-CM | POA: Diagnosis not present

## 2022-11-08 DIAGNOSIS — M170 Bilateral primary osteoarthritis of knee: Secondary | ICD-10-CM | POA: Diagnosis not present

## 2022-12-12 DIAGNOSIS — M1711 Unilateral primary osteoarthritis, right knee: Secondary | ICD-10-CM | POA: Diagnosis not present

## 2022-12-12 DIAGNOSIS — G894 Chronic pain syndrome: Secondary | ICD-10-CM | POA: Diagnosis not present

## 2022-12-12 DIAGNOSIS — Z79891 Long term (current) use of opiate analgesic: Secondary | ICD-10-CM | POA: Diagnosis not present

## 2022-12-12 DIAGNOSIS — M25561 Pain in right knee: Secondary | ICD-10-CM | POA: Diagnosis not present

## 2022-12-24 DIAGNOSIS — R609 Edema, unspecified: Secondary | ICD-10-CM | POA: Diagnosis not present

## 2022-12-24 DIAGNOSIS — I10 Essential (primary) hypertension: Secondary | ICD-10-CM | POA: Diagnosis not present

## 2023-01-09 DIAGNOSIS — G894 Chronic pain syndrome: Secondary | ICD-10-CM | POA: Diagnosis not present

## 2023-01-09 DIAGNOSIS — M25561 Pain in right knee: Secondary | ICD-10-CM | POA: Diagnosis not present

## 2023-01-09 DIAGNOSIS — M1711 Unilateral primary osteoarthritis, right knee: Secondary | ICD-10-CM | POA: Diagnosis not present

## 2023-01-09 DIAGNOSIS — Z79891 Long term (current) use of opiate analgesic: Secondary | ICD-10-CM | POA: Diagnosis not present

## 2023-01-10 DIAGNOSIS — R6889 Other general symptoms and signs: Secondary | ICD-10-CM | POA: Diagnosis not present

## 2023-01-10 DIAGNOSIS — U071 COVID-19: Secondary | ICD-10-CM | POA: Diagnosis not present

## 2023-01-21 DIAGNOSIS — M25561 Pain in right knee: Secondary | ICD-10-CM | POA: Diagnosis not present

## 2023-01-21 NOTE — H&P (Signed)
TOTAL KNEE ADMISSION H&P  Patient is being admitted for right total knee arthroplasty.  Subjective:  Chief Complaint: Right knee pain.  HPI: Luis Kim, 77 y.o. male has a history of pain and functional disability in the right knee due to arthritis and has failed non-surgical conservative treatments for greater than 12 weeks to include NSAID's and/or analgesics, corticosteriod injections, viscosupplementation injections, use of assistive devices, and activity modification. Onset of symptoms was gradual, starting several years ago with gradually worsening course since that time. The patient noted prior procedures on the knee to include  arthroscopy and menisectomy on the right knee.  Patient currently rates pain in the right knee at 7 out of 10 with activity. Patient has night pain, worsening of pain with activity and weight bearing, and pain that interferes with activities of daily living. Patient has evidence of  severe bone-on-bone arthritis in the lateral and patellofemoral compartments of the right knee  by imaging studies. There is no active infection.  Patient Active Problem List   Diagnosis Date Noted   Hyponatremia 10/18/2021   HTN (hypertension) 10/18/2021   Chronic alcohol abuse 10/18/2021   Syncope 10/17/2021   Right inguinal hernia 10/07/2013   Chest discomfort 07/23/2011   Dyslipidemia 07/23/2011    Past Medical History:  Diagnosis Date   Arthritis    Diabetes mellitus without complication ([place])    no meds   Hyperlipidemia    [facility] tick fever 5/15   positive    Shortness of breath    doe   Stroke [facility])    tia-no residual   Wears glasses    Wears partial dentures     Past Surgical History:  Procedure Laterality Date   INGUINAL HERNIA REPAIR Right 09/14/2014   Procedure: OPEN REPAIR RIGHT INGUINAL HERNIA;  Surgeon: Fanny Skates, MD;  Location: [place];  Service: General;  Laterality: Right;   INSERTION OF MESH Left 09/14/2014    Procedure: INSERTION OF MESH;  Surgeon: Fanny Skates, MD;  Location: [place];  Service: General;  Laterality: Left;   KNEE SURGERY     right x3    Prior to Admission medications   Medication Sig Start Date End Date Taking? Authorizing Provider  allopurinol (ZYLOPRIM) 100 MG tablet Take 100 mg by mouth daily. 08/16/21   [provider]  carvedilol (COREG) 12.5 MG tablet Take 12.5 mg by mouth 2 (two) times daily with a meal.    [provider]  diphenoxylate-atropine (LOMOTIL) 2.5-0.025 MG tablet Take 1 tablet by mouth 2 (two) times daily as needed. 10/12/21   [provider]  fluticasone (FLONASE) 50 MCG/ACT nasal spray Place 1 spray into both nostrils daily as needed for allergies or rhinitis.    [provider]  furosemide (LASIX) 20 MG tablet Take 20 mg by mouth daily.    [provider]  oxyCODONE-acetaminophen (PERCOCET/ROXICET) 5-325 MG tablet Take 1 tablet by mouth 3 (three) times daily as needed for severe pain. 09/21/21   [provider]  ramipril (ALTACE) 10 MG capsule Take 20 mg by mouth daily.     [provider]  sertraline (ZOLOFT) 50 MG tablet Take 50 mg by mouth daily as needed (depression). 09/12/21   [provider]  simvastatin (ZOCOR) 20 MG tablet Take 20 mg by mouth at bedtime.      [provider]  tetrahydrozoline 0.05 % ophthalmic solution Place 1 drop into both eyes daily as needed (for irritation).  [provider]  thiamine 100 MG tablet Take 1 tablet (100 mg total) by mouth daily. 10/19/21   Bonnielee Haff, MD    No Known Allergies  Social History   Socioeconomic History   Marital status: Married    Spouse name: Not on file   Number of children: Not on file   Years of education: Not on file   Highest education level: Not on file  Occupational History   Not on file  Tobacco Use   Smoking status: Never   Smokeless tobacco: Current    Types: Chew   Substance and Sexual Activity   Alcohol use: Yes    Comment: chronic heavy drinker   Drug use: No   Sexual activity: Not on file  Other Topics Concern   Not on file  Social History Narrative   Not on file   Social Determinants of Health   Financial Resource Strain: Not on file  Food Insecurity: Not on file  Transportation Needs: Not on file  Physical Activity: Not on file  Stress: Not on file  Social Connections: Not on file  Intimate Partner Violence: Not on file    Tobacco Use: High Risk (10/17/2021)   Patient History    Smoking Tobacco Use: Never    Smokeless Tobacco Use: Current    Passive Exposure: Not on file   Social History   Substance and Sexual Activity  Alcohol Use Yes   Comment: chronic heavy drinker    Family History  Problem Relation Age of Onset   Hypertension Other    Cancer Other    Cancer Mother        ovarian   Cancer Father        leukemia    Review of Systems  Constitutional:  Negative for chills and fever.  HENT: Negative.    Eyes: Negative.   Respiratory:  Negative for cough and shortness of breath.   Cardiovascular:  Negative for chest pain and palpitations.  Gastrointestinal:  Negative for abdominal pain, constipation, diarrhea, nausea and vomiting.  Genitourinary:  Negative for dysuria, frequency and urgency.  Musculoskeletal:  Positive for joint pain.  Skin:  Negative for rash.   Objective:  Physical Exam: Well nourished and well developed.  General: Alert and oriented x3, cooperative and pleasant, no acute distress.  Head: normocephalic, atraumatic, neck supple.  Eyes: EOMI. Abdomen: non-tender to palpation and soft, normoactive bowel sounds. Musculoskeletal: The patient has a significantly antalgic gait pattern favoring the right side without the use of assistive devices.  Bilateral Hip Exam: The range of motion: normal without discomfort.  Right Knee Exam: Slight valgus deformity. No effusion present. No swelling  present. The range of motion is: 5 to 120 degrees. Marked crepitus on range of motion of the knee. Positive lateral greater than medial joint line tenderness. The knee is stable.  Left Knee Exam: No effusion present. No swelling present. The Range of motion is: 0 to 125 degrees. Positive crepitus on range of motion of the knee. No medial joint line tenderness. No lateral joint line tenderness. The knee is stable.  Calves soft and nontender. Motor function intact in LE. Strength 5/5 LE bilaterally. Neuro: Distal pulses 2+. Sensation to light touch intact in LE.  Vital signs in last 24 hours: BP: ()/()  Arterial Line BP: ()/()   Imaging Review Plain radiographs demonstrate severe degenerative joint disease of the right knee. The overall alignment is mild valgus. The bone quality appears to be adequate for age and  reported activity level.  Assessment/Plan:  End stage arthritis, right knee   The patient history, physical examination, clinical judgment of the provider and imaging studies are consistent with end stage degenerative joint disease of the right knee and total knee arthroplasty is deemed medically necessary. The treatment options including medical management, injection therapy arthroscopy and arthroplasty were discussed at length. The risks and benefits of total knee arthroplasty were presented and reviewed. The risks due to aseptic loosening, infection, stiffness, patella tracking problems, thromboembolic complications and other imponderables were discussed. The patient acknowledged the explanation, agreed to proceed with the plan and consent was signed. Patient is being admitted for inpatient treatment for surgery, pain control, PT, OT, prophylactic antibiotics, VTE prophylaxis, progressive ambulation and ADLs and discharge planning. The patient is planning to be discharged  home .  Patient's anticipated LOS is less than 2 midnights, meeting these requirements: - Lives within 1  hour of care - Has a competent adult at home to recover with post-op  - NO history of  - Diabetes  - Coronary Artery Disease  - Heart failure  - Heart attack  - Stroke  - DVT/VTE  - Cardiac arrhythmia  - Respiratory Failure/COPD  - Renal failure  - Anemia  - Advanced Liver disease  Therapy Plans: [facility]) Disposition: Home with Wife Planned DVT Prophylaxis: Aspirin 81 mg BID DME Needed: None PCP: Cyndi Bender, PA-C (contacting for clearance) TXA: IV Allergies: NKDA Anesthesia Concerns: None BMI: 28.8 Last HgbA1c: not diabetic  Pharmacy: [facility]  Other: -Given clearance form to take to PCP -Currently on Oxycodone-Acetaminophen '10mg'$ -'325mg'$  five times a day - discussed dilaudid for breakthrough pain but will check with Dr. Wynelle Link  - Patient was instructed on what medications to stop prior to surgery. - Follow-up visit in 2 weeks with Dr. Wynelle Link - Begin physical therapy following surgery - Pre-operative lab work as pre-surgical testing - Prescriptions will be provided in hospital at time of discharge  R. Jaynie Bream, PA-C Orthopedic Surgery [facility] [place]

## 2023-01-31 NOTE — Progress Notes (Addendum)
Anesthesia Review:  PCP: Cyndi Bender  Clearance dated 02/13/23 on chart LOV3/21/24 on chart  Cardiologist : none  Chest x-ray : EKG : 02/04/23  and 02/13/23 on chart  Echo : 10/18/21  Stress test: Cardiac Cath :  Activity level: can do a flgiht of stairs without difficulty  Sleep Study/ CPAP : none  Fasting Blood Sugar :      / Checks Blood Sugar -- times a day:   Blood Thinner/ Instructions /Last Dose: ASA / Instructions/ Last Dose :   PT states positive for covid 12/2022.   Unsure of date.  Tested at MD office and received meds.[place] office and requested OV note.  They are to fax.  Covid OV date was 01/10/23 per office. Note on chart  from Cyndi Bender office.  Treated with Paxlovid for Covid on 01/10/23      [facility] at preop appt to reconcile meds.  Noone ever  answered phone.  PT given phone number at preop of pharmacy and instructed to call to reconcile meds.  PT voiced understanding.    Hgba1c-02/04/23-5.1

## 2023-01-31 NOTE — Patient Instructions (Signed)
SURGICAL WAITING ROOM VISITATION  Patients having surgery or a procedure may have no more than 2 support people in the waiting area - these visitors may rotate.    Children under the age of 71 must have an adult with them who is not the patient.  Due to an increase in RSV and influenza rates and associated hospitalizations, children ages 29 and under may not visit patients in [place].  If the patient needs to stay at the hospital during part of their recovery, the visitor guidelines for inpatient rooms apply. Pre-op nurse will coordinate an appropriate time for 1 support person to accompany patient in pre-op.  This support person may not rotate.    Please refer to the [facility] website for the visitor guidelines for Inpatients (after your surgery is over and you are in a regular room).       Your procedure is scheduled on:  02/17/23    Report to [facility] Main Entrance    Report to admitting at0900 AM   Call this number if you have problems the morning of surgery [phone]   Do not eat food :After Midnight.   After Midnight you may have the following liquids until _ 0830_____ AM  DAY OF SURGERY  Water Non-Citrus Juices (without pulp, NO RED-Apple, White grape, White cranberry) Black Coffee (NO MILK/CREAM OR CREAMERS, sugar ok)  Clear Tea (NO MILK/CREAM OR CREAMERS, sugar ok) regular and decaf                             Plain Jell-O (NO RED)                                           Fruit ices (not with fruit pulp, NO RED)                                     Popsicles (NO RED)                                                               Sports drinks like Gatorade (NO RED)                    The day of surgery:  Drink ONE (1) Pre-Surgery Clear Ensure or G2 at    0830AM ( have completed by )  the morning of surgery. Drink in one sitting. Do not sip.  This drink was given to you during your hospital  pre-op appointment visit. Nothing else to  drink after completing the  Pre-Surgery Clear Ensure or G2.          If you have questions, please contact your surgeon's office.   FOLLOW BOWEL PREP AND ANY ADDITIONAL PRE OP INSTRUCTIONS YOU RECEIVED FROM YOUR SURGEON'S OFFICE!!!     Oral Hygiene is also important to reduce your risk of infection.  Remember - BRUSH YOUR TEETH THE MORNING OF SURGERY WITH YOUR REGULAR TOOTHPASTE  DENTURES WILL BE REMOVED PRIOR TO SURGERY PLEASE DO NOT APPLY "Poly grip" OR ADHESIVES!!!   Do NOT smoke after Midnight   Take these medicines the morning of surgery with A SIP OF WATER: alopurinol, coreg   DO NOT TAKE ANY ORAL DIABETIC MEDICATIONS DAY OF YOUR SURGERY  Bring CPAP mask and tubing day of surgery.                              You may not have any metal on your body including hair pins, jewelry, and body piercing             Do not wear make-up, lotions, powders, perfumes/cologne, or deodorant  Do not wear nail polish including gel and S&S, artificial/acrylic nails, or any other type of covering on natural nails including finger and toenails. If you have artificial nails, gel coating, etc. that needs to be removed by a nail salon please have this removed prior to surgery or surgery may need to be canceled/ delayed if the surgeon/ anesthesia feels like they are unable to be safely monitored.   Do not shave  48 hours prior to surgery.               Men may shave face and neck.   Do not bring valuables to the hospital. [place].   Contacts, glasses, dentures or bridgework may not be worn into surgery.   Bring small overnight bag day of surgery.   DO NOT [place]. PHARMACY WILL DISPENSE MEDICATIONS LISTED ON YOUR MEDICATION LIST TO YOU DURING YOUR ADMISSION [place]!    Patients discharged on the day of surgery will not be allowed to drive home.  Someone NEEDS to  stay with you for the first 24 hours after anesthesia.   Special Instructions: Bring a copy of your healthcare power of attorney and living will documents the day of surgery if you haven't scanned them before.              Please read over the following fact sheets you were given: IF [place] [phone]   If you received a COVID test during your pre-op visit  it is requested that you wear a mask when out in public, stay away from anyone that may not be feeling well and notify your surgeon if you develop symptoms. If you test positive for Covid or have been in contact with anyone that has tested positive in the last 10 days please notify you surgeon.    [place] - Preparing for Surgery Before surgery, you can play an important role.  Because skin is not sterile, your skin needs to be as free of germs as possible.  You can reduce the number of germs on your skin by washing with CHG (chlorahexidine gluconate) soap before surgery.  CHG is an antiseptic cleaner which kills germs and bonds with the skin to continue killing germs even after washing. Please DO NOT use if you have an allergy to CHG or antibacterial soaps.  If your skin becomes reddened/irritated stop using the CHG and inform your nurse when you arrive at Short Stay. Do not shave (including legs and underarms) for  at least 48 hours prior to the first CHG shower.  You may shave your face/neck. Please follow these instructions carefully:  1.  Shower with CHG Soap the night before surgery and the  morning of Surgery.  2.  If you choose to wash your hair, wash your hair first as usual with your  normal  shampoo.  3.  After you shampoo, rinse your hair and body thoroughly to remove the  shampoo.                           4.  Use CHG as you would any other liquid soap.  You can apply chg directly  to the skin and wash                       Gently with a scrungie or clean washcloth.  5.   Apply the CHG Soap to your body ONLY FROM THE NECK DOWN.   Do not use on face/ open                           Wound or open sores. Avoid contact with eyes, ears mouth and genitals (private parts).                       Wash face,  Genitals (private parts) with your normal soap.             6.  Wash thoroughly, paying special attention to the area where your surgery  will be performed.  7.  Thoroughly rinse your body with warm water from the neck down.  8.  DO NOT shower/wash with your normal soap after using and rinsing off  the CHG Soap.                9.  Pat yourself dry with a clean towel.            10.  Wear clean pajamas.            11.  Place clean sheets on your bed the night of your first shower and do not  sleep with pets. Day of Surgery : Do not apply any lotions/deodorants the morning of surgery.  Please wear clean clothes to the hospital/surgery center.  FAILURE TO FOLLOW THESE INSTRUCTIONS MAY RESULT IN THE CANCELLATION OF YOUR SURGERY PATIENT SIGNATURE_________________________________  NURSE SIGNATURE__________________________________  ________________________________________________________________________

## 2023-02-04 ENCOUNTER — Other Ambulatory Visit: Payer: Self-pay

## 2023-02-04 ENCOUNTER — Encounter (HOSPITAL_COMMUNITY)
Admission: RE | Admit: 2023-02-04 | Discharge: 2023-02-04 | Disposition: A | Discharge status: Home / Self Care | Payer: Medicare Other | Source: Ambulatory Visit | Attending: Orthopedic Surgery | Admitting: Orthopedic Surgery

## 2023-02-04 ENCOUNTER — Encounter (HOSPITAL_COMMUNITY): Payer: Self-pay

## 2023-02-04 VITALS — BP 132/93 | HR 84 | Temp 98.3°F | Resp 16 | Wt 142.0 lb

## 2023-02-04 DIAGNOSIS — Z01818 Encounter for other preprocedural examination: Secondary | ICD-10-CM | POA: Diagnosis not present

## 2023-02-04 DIAGNOSIS — E139 Other specified diabetes mellitus without complications: Secondary | ICD-10-CM | POA: Diagnosis not present

## 2023-02-04 HISTORY — DX: Depression, unspecified: F32.A

## 2023-02-04 HISTORY — DX: Essential (primary) hypertension: I10

## 2023-02-04 HISTORY — DX: Anxiety disorder, unspecified: F41.9

## 2023-02-04 LAB — CBC
HCT: 36.8 % — ABNORMAL LOW (ref 39.0–52.0)
Hemoglobin: 12.2 g/dL — ABNORMAL LOW (ref 13.0–17.0)
MCH: 33.7 pg (ref 26.0–34.0)
MCHC: 33.2 g/dL (ref 30.0–36.0)
MCV: 101.7 fL — ABNORMAL HIGH (ref 80.0–100.0)
Platelets: 180 10*3/uL (ref 150–400)
RBC: 3.62 MIL/uL — ABNORMAL LOW (ref 4.22–5.81)
RDW: 13.6 % (ref 11.5–15.5)
WBC: 7 10*3/uL (ref 4.0–10.5)
nRBC: 0 % (ref 0.0–0.2)

## 2023-02-04 LAB — BASIC METABOLIC PANEL
Anion gap: 8 (ref 5–15)
BUN: 16 mg/dL (ref 8–23)
CO2: 24 mmol/L (ref 22–32)
Calcium: 8.5 mg/dL — ABNORMAL LOW (ref 8.9–10.3)
Chloride: 104 mmol/L (ref 98–111)
Creatinine, Ser: 1.01 mg/dL (ref 0.61–1.24)
GFR, Estimated: >60 mL/min (ref >60)
Glucose, Bld: 103 mg/dL — ABNORMAL HIGH (ref 70–99)
Potassium: 4.4 mmol/L (ref 3.5–5.1)
Sodium: 136 mmol/L (ref 135–145)

## 2023-02-04 LAB — SURGICAL PCR SCREEN
MRSA, PCR: NEGATIVE
Staphylococcus aureus: NEGATIVE

## 2023-02-05 LAB — HEMOGLOBIN A1C
Hgb A1c MFr Bld: 5.1 % (ref 4.8–5.6)
Mean Plasma Glucose: 100 mg/dL

## 2023-02-06 DIAGNOSIS — M25561 Pain in right knee: Secondary | ICD-10-CM | POA: Diagnosis not present

## 2023-02-06 DIAGNOSIS — Z79891 Long term (current) use of opiate analgesic: Secondary | ICD-10-CM | POA: Diagnosis not present

## 2023-02-06 DIAGNOSIS — G894 Chronic pain syndrome: Secondary | ICD-10-CM | POA: Diagnosis not present

## 2023-02-06 DIAGNOSIS — M1711 Unilateral primary osteoarthritis, right knee: Secondary | ICD-10-CM | POA: Diagnosis not present

## 2023-02-06 DIAGNOSIS — Z1389 Encounter for screening for other disorder: Secondary | ICD-10-CM | POA: Diagnosis not present

## 2023-02-13 DIAGNOSIS — R600 Localized edema: Secondary | ICD-10-CM | POA: Diagnosis not present

## 2023-02-13 DIAGNOSIS — I10 Essential (primary) hypertension: Secondary | ICD-10-CM | POA: Diagnosis not present

## 2023-02-13 DIAGNOSIS — M109 Gout, unspecified: Secondary | ICD-10-CM | POA: Diagnosis not present

## 2023-02-13 DIAGNOSIS — K580 Irritable bowel syndrome with diarrhea: Secondary | ICD-10-CM | POA: Diagnosis not present

## 2023-02-13 DIAGNOSIS — F325 Major depressive disorder, single episode, in full remission: Secondary | ICD-10-CM | POA: Diagnosis not present

## 2023-02-13 DIAGNOSIS — M179 Osteoarthritis of knee, unspecified: Secondary | ICD-10-CM | POA: Diagnosis not present

## 2023-02-13 DIAGNOSIS — E7800 Pure hypercholesterolemia, unspecified: Secondary | ICD-10-CM | POA: Diagnosis not present

## 2023-02-13 DIAGNOSIS — Z01818 Encounter for other preprocedural examination: Secondary | ICD-10-CM | POA: Diagnosis not present

## 2023-02-17 ENCOUNTER — Other Ambulatory Visit: Payer: Self-pay

## 2023-02-17 ENCOUNTER — Encounter (HOSPITAL_COMMUNITY)
Admission: RE | Disposition: A | Discharge status: Home / Self Care | Payer: Self-pay | Source: Ambulatory Visit | Attending: Orthopedic Surgery

## 2023-02-17 ENCOUNTER — Encounter (HOSPITAL_COMMUNITY): Payer: Self-pay | Admitting: Orthopedic Surgery

## 2023-02-17 ENCOUNTER — Ambulatory Visit (HOSPITAL_COMMUNITY): Payer: Medicare Other | Admitting: Anesthesiology

## 2023-02-17 ENCOUNTER — Inpatient Hospital Stay (HOSPITAL_COMMUNITY)
Admission: RE | Admit: 2023-02-17 | Discharge: 2023-02-19 | DRG: 470 | Disposition: A | Discharge status: Home / Self Care | Payer: Medicare Other | Source: Ambulatory Visit | Attending: Orthopedic Surgery | Admitting: Orthopedic Surgery

## 2023-02-17 ENCOUNTER — Ambulatory Visit (HOSPITAL_COMMUNITY): Payer: Medicare Other | Admitting: Physician Assistant

## 2023-02-17 DIAGNOSIS — G8918 Other acute postprocedural pain: Secondary | ICD-10-CM | POA: Diagnosis not present

## 2023-02-17 DIAGNOSIS — Z79899 Other long term (current) drug therapy: Secondary | ICD-10-CM

## 2023-02-17 DIAGNOSIS — Z8673 Personal history of transient ischemic attack (TIA), and cerebral infarction without residual deficits: Secondary | ICD-10-CM | POA: Diagnosis not present

## 2023-02-17 DIAGNOSIS — Z01818 Encounter for other preprocedural examination: Secondary | ICD-10-CM

## 2023-02-17 DIAGNOSIS — Z8249 Family history of ischemic heart disease and other diseases of the circulatory system: Secondary | ICD-10-CM

## 2023-02-17 DIAGNOSIS — F419 Anxiety disorder, unspecified: Secondary | ICD-10-CM | POA: Diagnosis present

## 2023-02-17 DIAGNOSIS — I10 Essential (primary) hypertension: Secondary | ICD-10-CM | POA: Diagnosis present

## 2023-02-17 DIAGNOSIS — M1711 Unilateral primary osteoarthritis, right knee: Secondary | ICD-10-CM | POA: Diagnosis not present

## 2023-02-17 DIAGNOSIS — E785 Hyperlipidemia, unspecified: Secondary | ICD-10-CM | POA: Diagnosis present

## 2023-02-17 DIAGNOSIS — M1710 Unilateral primary osteoarthritis, unspecified knee: Secondary | ICD-10-CM | POA: Diagnosis present

## 2023-02-17 DIAGNOSIS — F32A Depression, unspecified: Secondary | ICD-10-CM | POA: Diagnosis present

## 2023-02-17 DIAGNOSIS — M179 Osteoarthritis of knee, unspecified: Principal | ICD-10-CM

## 2023-02-17 DIAGNOSIS — E119 Type 2 diabetes mellitus without complications: Secondary | ICD-10-CM | POA: Diagnosis present

## 2023-02-17 HISTORY — PX: TOTAL KNEE ARTHROPLASTY: SHX125

## 2023-02-17 SURGERY — ARTHROPLASTY, KNEE, TOTAL
Anesthesia: Spinal | Site: Knee | Laterality: Right

## 2023-02-17 MED ORDER — METOCLOPRAMIDE HCL 5 MG/ML IJ SOLN: 5.0000 mg | Freq: Three times a day (TID) | INTRAMUSCULAR | Status: DC | PRN

## 2023-02-17 MED ORDER — ONDANSETRON HCL 4 MG PO TABS: 4.0000 mg | ORAL_TABLET | Freq: Four times a day (QID) | ORAL | Status: DC | PRN

## 2023-02-17 MED ORDER — METHOCARBAMOL 1000 MG/10ML IJ SOLN: 500.0000 mg | Freq: Four times a day (QID) | INTRAVENOUS | Status: DC | PRN

## 2023-02-17 MED ORDER — LACTATED RINGERS IV SOLN: INTRAVENOUS | Status: DC

## 2023-02-17 MED ORDER — SODIUM CHLORIDE 0.9 % IV SOLN: INTRAVENOUS | Status: DC

## 2023-02-17 MED ORDER — BUPIVACAINE IN DEXTROSE 0.75-8.25 % IT SOLN
INTRATHECAL | Status: DC | PRN
  Administered 2023-02-17: 1.6 mL via INTRATHECAL

## 2023-02-17 MED ORDER — DEXAMETHASONE SODIUM PHOSPHATE 10 MG/ML IJ SOLN
8.0000 mg | Freq: Once | INTRAMUSCULAR | Status: AC
  Administered 2023-02-17: 8 mg via INTRAVENOUS

## 2023-02-17 MED ORDER — BUPIVACAINE LIPOSOME 1.3 % IJ SUSP
INTRAMUSCULAR | Status: DC | PRN
  Administered 2023-02-17: 20 mL

## 2023-02-17 MED ORDER — METHOCARBAMOL 500 MG PO TABS
500.0000 mg | ORAL_TABLET | Freq: Four times a day (QID) | ORAL | Status: DC | PRN
  Administered 2023-02-17 – 2023-02-19 (×4): 500 mg via ORAL
  Filled 2023-02-17 (×4): qty 1

## 2023-02-17 MED ORDER — POVIDONE-IODINE 10 % EX SWAB
2.0000 | Freq: Once | CUTANEOUS | Status: AC
  Administered 2023-02-17: 2 via TOPICAL

## 2023-02-17 MED ORDER — HYDRALAZINE HCL 20 MG/ML IJ SOLN
INTRAMUSCULAR | Status: AC
  Filled 2023-02-17: qty 1

## 2023-02-17 MED ORDER — TRANEXAMIC ACID-NACL 1000-0.7 MG/100ML-% IV SOLN
1000.0000 mg | INTRAVENOUS | Status: AC
  Administered 2023-02-17: 1000 mg via INTRAVENOUS

## 2023-02-17 MED ORDER — DOCUSATE SODIUM 100 MG PO CAPS
100.0000 mg | ORAL_CAPSULE | Freq: Two times a day (BID) | ORAL | Status: DC
  Administered 2023-02-17 – 2023-02-19 (×4): 100 mg via ORAL
  Filled 2023-02-17 (×4): qty 1

## 2023-02-17 MED ORDER — SODIUM CHLORIDE (PF) 0.9 % IJ SOLN
INTRAMUSCULAR | Status: DC | PRN
  Administered 2023-02-17: 10 mL
  Administered 2023-02-17: 50 mL

## 2023-02-17 MED ORDER — SODIUM CHLORIDE (PF) 0.9 % IJ SOLN
INTRAMUSCULAR | Status: AC
  Filled 2023-02-17: qty 10

## 2023-02-17 MED ORDER — ONDANSETRON HCL 4 MG/2ML IJ SOLN
INTRAMUSCULAR | Status: DC | PRN
  Administered 2023-02-17: 4 mg via INTRAVENOUS

## 2023-02-17 MED ORDER — BISACODYL 10 MG RE SUPP: 10.0000 mg | Freq: Every day | RECTAL | Status: DC | PRN

## 2023-02-17 MED ORDER — ASPIRIN 81 MG PO CHEW
81.0000 mg | CHEWABLE_TABLET | Freq: Two times a day (BID) | ORAL | Status: DC
  Administered 2023-02-18 – 2023-02-19 (×3): 81 mg via ORAL
  Filled 2023-02-17 (×3): qty 1

## 2023-02-17 MED ORDER — ACETAMINOPHEN 500 MG PO TABS
1000.0000 mg | ORAL_TABLET | Freq: Four times a day (QID) | ORAL | Status: AC
  Administered 2023-02-17 – 2023-02-18 (×3): 1000 mg via ORAL
  Filled 2023-02-17 (×3): qty 2

## 2023-02-17 MED ORDER — CARVEDILOL 12.5 MG PO TABS
12.5000 mg | ORAL_TABLET | ORAL | Status: AC
  Administered 2023-02-17: 12.5 mg via ORAL
  Filled 2023-02-17: qty 1

## 2023-02-17 MED ORDER — FUROSEMIDE 20 MG PO TABS
20.0000 mg | ORAL_TABLET | Freq: Every day | ORAL | Status: DC
  Administered 2023-02-18 – 2023-02-19 (×2): 20 mg via ORAL
  Filled 2023-02-17 (×2): qty 1

## 2023-02-17 MED ORDER — OXYCODONE-ACETAMINOPHEN 10-325 MG PO TABS: 1.0000 | ORAL_TABLET | Freq: Four times a day (QID) | ORAL | Status: DC | PRN

## 2023-02-17 MED ORDER — OXYCODONE-ACETAMINOPHEN 5-325 MG PO TABS
1.0000 | ORAL_TABLET | Freq: Four times a day (QID) | ORAL | Status: DC | PRN
  Administered 2023-02-18 – 2023-02-19 (×4): 1 via ORAL
  Filled 2023-02-17 (×4): qty 1

## 2023-02-17 MED ORDER — ONDANSETRON HCL 4 MG/2ML IJ SOLN
INTRAMUSCULAR | Status: AC
  Filled 2023-02-17: qty 2

## 2023-02-17 MED ORDER — BUPIVACAINE-EPINEPHRINE (PF) 0.5% -1:200000 IJ SOLN
INTRAMUSCULAR | Status: DC | PRN
  Administered 2023-02-17: 20 mL via PERINEURAL

## 2023-02-17 MED ORDER — ACETAMINOPHEN 10 MG/ML IV SOLN: 1000.0000 mg | Freq: Once | INTRAVENOUS | Status: DC

## 2023-02-17 MED ORDER — 0.9 % SODIUM CHLORIDE (POUR BTL) OPTIME
TOPICAL | Status: DC | PRN
  Administered 2023-02-17: 1000 mL

## 2023-02-17 MED ORDER — HYDROMORPHONE HCL 2 MG PO TABS
2.0000 mg | ORAL_TABLET | ORAL | Status: DC | PRN
  Administered 2023-02-17 – 2023-02-18 (×4): 2 mg via ORAL
  Filled 2023-02-17 (×4): qty 1

## 2023-02-17 MED ORDER — MIDAZOLAM HCL 2 MG/2ML IJ SOLN
1.0000 mg | INTRAMUSCULAR | Status: DC
  Filled 2023-02-17: qty 2

## 2023-02-17 MED ORDER — ACETAMINOPHEN 500 MG PO TABS
1000.0000 mg | ORAL_TABLET | Freq: Once | ORAL | Status: DC
  Filled 2023-02-17: qty 2

## 2023-02-17 MED ORDER — METOCLOPRAMIDE HCL 5 MG PO TABS: 5.0000 mg | ORAL_TABLET | Freq: Three times a day (TID) | ORAL | Status: DC | PRN

## 2023-02-17 MED ORDER — DEXAMETHASONE SODIUM PHOSPHATE 10 MG/ML IJ SOLN
10.0000 mg | Freq: Once | INTRAMUSCULAR | Status: AC
  Administered 2023-02-18: 10 mg via INTRAVENOUS
  Filled 2023-02-17: qty 1

## 2023-02-17 MED ORDER — PHENYLEPHRINE HCL-NACL 20-0.9 MG/250ML-% IV SOLN
INTRAVENOUS | Status: DC | PRN
  Administered 2023-02-17: 50 ug/min via INTRAVENOUS

## 2023-02-17 MED ORDER — OXYCODONE HCL 5 MG PO TABS
5.0000 mg | ORAL_TABLET | Freq: Four times a day (QID) | ORAL | Status: DC | PRN
  Administered 2023-02-18 – 2023-02-19 (×4): 5 mg via ORAL
  Filled 2023-02-17 (×4): qty 1

## 2023-02-17 MED ORDER — ALLOPURINOL 300 MG PO TABS
300.0000 mg | ORAL_TABLET | Freq: Every day | ORAL | Status: DC
  Administered 2023-02-18 – 2023-02-19 (×2): 300 mg via ORAL
  Filled 2023-02-17 (×2): qty 1

## 2023-02-17 MED ORDER — HYDRALAZINE HCL 20 MG/ML IJ SOLN
5.0000 mg | Freq: Once | INTRAMUSCULAR | Status: AC
  Administered 2023-02-17: 5 mg via INTRAVENOUS

## 2023-02-17 MED ORDER — SODIUM CHLORIDE (PF) 0.9 % IJ SOLN
INTRAMUSCULAR | Status: AC
  Filled 2023-02-17: qty 50

## 2023-02-17 MED ORDER — PHENOL 1.4 % MT LIQD: 1.0000 | OROMUCOSAL | Status: DC | PRN

## 2023-02-17 MED ORDER — BUPIVACAINE LIPOSOME 1.3 % IJ SUSP: 20.0000 mL | Freq: Once | INTRAMUSCULAR | Status: DC

## 2023-02-17 MED ORDER — DIPHENHYDRAMINE HCL 12.5 MG/5ML PO ELIX: 12.5000 mg | ORAL_SOLUTION | ORAL | Status: DC | PRN

## 2023-02-17 MED ORDER — MENTHOL 3 MG MT LOZG: 1.0000 | LOZENGE | OROMUCOSAL | Status: DC | PRN

## 2023-02-17 MED ORDER — BUPIVACAINE LIPOSOME 1.3 % IJ SUSP
INTRAMUSCULAR | Status: AC
  Filled 2023-02-17: qty 20

## 2023-02-17 MED ORDER — STERILE WATER FOR IRRIGATION IR SOLN
Status: DC | PRN
  Administered 2023-02-17 (×2): 1000 mL

## 2023-02-17 MED ORDER — HYDROMORPHONE HCL 1 MG/ML IJ SOLN
0.5000 mg | INTRAMUSCULAR | Status: DC | PRN
  Administered 2023-02-17: 0.5 mg via INTRAVENOUS
  Administered 2023-02-18 (×2): 1 mg via INTRAVENOUS
  Filled 2023-02-17 (×4): qty 1

## 2023-02-17 MED ORDER — SODIUM CHLORIDE 0.9 % IR SOLN
Status: DC | PRN
  Administered 2023-02-17: 1000 mL

## 2023-02-17 MED ORDER — DEXAMETHASONE SODIUM PHOSPHATE 10 MG/ML IJ SOLN
INTRAMUSCULAR | Status: AC
  Filled 2023-02-17: qty 1

## 2023-02-17 MED ORDER — PROPOFOL 500 MG/50ML IV EMUL
INTRAVENOUS | Status: DC | PRN
  Administered 2023-02-17: 120 ug/kg/min via INTRAVENOUS

## 2023-02-17 MED ORDER — CEFAZOLIN SODIUM-DEXTROSE 2-4 GM/100ML-% IV SOLN
2.0000 g | Freq: Four times a day (QID) | INTRAVENOUS | Status: AC
  Administered 2023-02-17 (×2): 2 g via INTRAVENOUS
  Filled 2023-02-17 (×2): qty 100

## 2023-02-17 MED ORDER — POLYETHYLENE GLYCOL 3350 17 G PO PACK: 17.0000 g | PACK | Freq: Every day | ORAL | Status: DC | PRN

## 2023-02-17 MED ORDER — CARVEDILOL 12.5 MG PO TABS
12.5000 mg | ORAL_TABLET | Freq: Two times a day (BID) | ORAL | Status: DC
  Administered 2023-02-17 – 2023-02-19 (×4): 12.5 mg via ORAL
  Filled 2023-02-17 (×4): qty 1

## 2023-02-17 MED ORDER — ACETAMINOPHEN 10 MG/ML IV SOLN
INTRAVENOUS | Status: DC | PRN
  Administered 2023-02-17: 1000 mg via INTRAVENOUS

## 2023-02-17 MED ORDER — FENTANYL CITRATE PF 50 MCG/ML IJ SOSY
50.0000 ug | PREFILLED_SYRINGE | INTRAMUSCULAR | Status: DC
  Administered 2023-02-17: 50 ug via INTRAVENOUS
  Filled 2023-02-17: qty 2

## 2023-02-17 MED ORDER — FLEET ENEMA 7-19 GM/118ML RE ENEM: 1.0000 | ENEMA | Freq: Once | RECTAL | Status: DC | PRN

## 2023-02-17 MED ORDER — SERTRALINE HCL 50 MG PO TABS
50.0000 mg | ORAL_TABLET | Freq: Every day | ORAL | Status: DC
  Administered 2023-02-18 – 2023-02-19 (×2): 50 mg via ORAL
  Filled 2023-02-17 (×2): qty 1

## 2023-02-17 MED ORDER — CHLORHEXIDINE GLUCONATE 0.12 % MT SOLN
15.0000 mL | Freq: Once | OROMUCOSAL | Status: AC
  Administered 2023-02-17: 15 mL via OROMUCOSAL

## 2023-02-17 MED ORDER — SIMVASTATIN 20 MG PO TABS
20.0000 mg | ORAL_TABLET | Freq: Every day | ORAL | Status: DC
  Administered 2023-02-18: 20 mg via ORAL
  Filled 2023-02-17: qty 1

## 2023-02-17 MED ORDER — CEFAZOLIN SODIUM-DEXTROSE 2-4 GM/100ML-% IV SOLN
2.0000 g | INTRAVENOUS | Status: AC
  Administered 2023-02-17: 2 g via INTRAVENOUS
  Filled 2023-02-17: qty 100

## 2023-02-17 MED ORDER — ORAL CARE MOUTH RINSE: 15.0000 mL | Freq: Once | OROMUCOSAL | Status: AC

## 2023-02-17 MED ORDER — ONDANSETRON HCL 4 MG/2ML IJ SOLN: 4.0000 mg | Freq: Four times a day (QID) | INTRAMUSCULAR | Status: DC | PRN

## 2023-02-17 SURGICAL SUPPLY — 59 items
ATTUNE MED DOME PAT 38 KNEE (Knees) IMPLANT
ATTUNE PS FEM RT SZ 6 CEM KNEE (Femur) IMPLANT
ATTUNE PSRP INSR SZ6 7 KNEE (Insert) IMPLANT
BAG COUNTER SPONGE SURGICOUNT (BAG) IMPLANT
BAG SPEC THK2 15X12 ZIP CLS (MISCELLANEOUS) ×1
BAG SPNG CNTER NS LX DISP (BAG)
BAG ZIPLOCK 12X15 (MISCELLANEOUS) ×1 IMPLANT
BASE TIBIA ATTUNE KNEE SYS SZ6 (Knees) IMPLANT
BLADE SAG 18X100X1.27 (BLADE) ×1 IMPLANT
BLADE SAW SGTL 11.0X1.19X90.0M (BLADE) ×1 IMPLANT
BNDG CMPR 5X62 HK CLSR LF (GAUZE/BANDAGES/DRESSINGS) ×1
BNDG CMPR MED 10X6 ELC LF (GAUZE/BANDAGES/DRESSINGS) ×1
BNDG ELASTIC 6INX 5YD STR LF (GAUZE/BANDAGES/DRESSINGS) ×1 IMPLANT
BNDG ELASTIC 6X10 VLCR STRL LF (GAUZE/BANDAGES/DRESSINGS) IMPLANT
BOWL SMART MIX CTS (DISPOSABLE) ×1 IMPLANT
BSPLAT TIB 6 CMNT ROT PLAT STR (Knees) ×1 IMPLANT
CEMENT HV SMART SET (Cement) ×2 IMPLANT
COVER SURGICAL LIGHT HANDLE (MISCELLANEOUS) ×1 IMPLANT
CUFF TOURN SGL QUICK 34 (TOURNIQUET CUFF) ×1
CUFF TRNQT CYL 34X4.125X (TOURNIQUET CUFF) ×1 IMPLANT
DRAPE INCISE IOBAN 66X45 STRL (DRAPES) ×1 IMPLANT
DRAPE U-SHAPE 47X51 STRL (DRAPES) ×1 IMPLANT
DRSG AQUACEL AG ADV 3.5X10 (GAUZE/BANDAGES/DRESSINGS) ×1 IMPLANT
DURAPREP 26ML APPLICATOR (WOUND CARE) ×1 IMPLANT
ELECT REM PT RETURN 15FT ADLT (MISCELLANEOUS) ×1 IMPLANT
GLOVE BIO SURGEON STRL SZ 6.5 (GLOVE) IMPLANT
GLOVE BIO SURGEON STRL SZ7.5 (GLOVE) IMPLANT
GLOVE BIO SURGEON STRL SZ8 (GLOVE) ×1 IMPLANT
GLOVE BIOGEL PI IND STRL 6.5 (GLOVE) IMPLANT
GLOVE BIOGEL PI IND STRL 7.0 (GLOVE) IMPLANT
GLOVE BIOGEL PI IND STRL 8 (GLOVE) ×1 IMPLANT
GOWN STRL REUS W/ TWL LRG LVL3 (GOWN DISPOSABLE) ×1 IMPLANT
GOWN STRL REUS W/ TWL XL LVL3 (GOWN DISPOSABLE) IMPLANT
GOWN STRL REUS W/TWL LRG LVL3 (GOWN DISPOSABLE) ×1
GOWN STRL REUS W/TWL XL LVL3 (GOWN DISPOSABLE)
HANDPIECE INTERPULSE COAX TIP (DISPOSABLE) ×1
HOLDER FOLEY CATH W/STRAP (MISCELLANEOUS) IMPLANT
IMMOBILIZER KNEE 20 (SOFTGOODS) ×1
IMMOBILIZER KNEE 20 THIGH 36 (SOFTGOODS) ×1 IMPLANT
KIT TURNOVER KIT A (KITS) IMPLANT
MANIFOLD NEPTUNE II (INSTRUMENTS) ×1 IMPLANT
NS IRRIG 1000ML POUR BTL (IV SOLUTION) ×1 IMPLANT
PACK TOTAL KNEE CUSTOM (KITS) ×1 IMPLANT
PADDING CAST COTTON 6X4 STRL (CAST SUPPLIES) ×2 IMPLANT
PIN STEINMAN FIXATION KNEE (PIN) IMPLANT
PROTECTOR NERVE ULNAR (MISCELLANEOUS) ×1 IMPLANT
SET HNDPC FAN SPRY TIP SCT (DISPOSABLE) ×1 IMPLANT
SPIKE FLUID TRANSFER (MISCELLANEOUS) ×1 IMPLANT
STRIP CLOSURE SKIN 1/2X4 (GAUZE/BANDAGES/DRESSINGS) ×2 IMPLANT
SUT MNCRL AB 4-0 PS2 18 (SUTURE) ×1 IMPLANT
SUT STRATAFIX 0 PDS 27 VIOLET (SUTURE) ×1
SUT VIC AB 2-0 CT1 27 (SUTURE) ×3
SUT VIC AB 2-0 CT1 TAPERPNT 27 (SUTURE) ×3 IMPLANT
SUTURE STRATFX 0 PDS 27 VIOLET (SUTURE) ×1 IMPLANT
TIBIA ATTUNE KNEE SYS BASE SZ6 (Knees) ×1 IMPLANT
TRAY FOLEY MTR SLVR 16FR STAT (SET/KITS/TRAYS/PACK) ×1 IMPLANT
TUBE SUCTION HIGH CAP CLEAR NV (SUCTIONS) ×1 IMPLANT
WATER STERILE IRR 1000ML POUR (IV SOLUTION) ×2 IMPLANT
WRAP KNEE MAXI GEL POST OP (GAUZE/BANDAGES/DRESSINGS) ×1 IMPLANT

## 2023-02-17 NOTE — Op Note (Signed)
OPERATIVE REPORT-TOTAL KNEE ARTHROPLASTY   Pre-operative diagnosis- Osteoarthritis  Right knee(s)  Post-operative diagnosis- Osteoarthritis Right knee(s)  Procedure-  Right  Total Knee Arthroplasty  Surgeon- Dione Plover. Kaidynce Pfister, MD  Assistant- Molli Barrows, PA-C   Anesthesia-   Adductor canal block and spinal  EBL-25 ml   Drains None  Tourniquet time-  Total Tourniquet Time Documented: Thigh (Right) - 32 minutes Total: Thigh (Right) - 32 minutes     Complications- None  Condition-PACU - hemodynamically stable.   Brief Clinical Note  Luis Kim is a 77 y.o. year old male with end stage OA of his right knee with progressively worsening pain and dysfunction. He has constant pain, with activity and at rest and significant functional deficits with difficulties even with ADLs. He has had extensive non-op management including analgesics, injections of cortisone and viscosupplements, and home exercise program, but remains in significant pain with significant dysfunction. Radiographs show bone on bone arthritis lateral and patellofemoral. He presents now for right Total Knee Arthroplasty.     Procedure in detail---   The patient is brought into the operating room and positioned supine on the operating table. After successful administration of  Adductor canal block and spinal,   a tourniquet is placed high on the  Right thigh(s) and the lower extremity is prepped and draped in the usual sterile fashion. Time out is performed by the operating team and then the  Right lower extremity is wrapped in Esmarch, knee flexed and the tourniquet inflated to 300 mmHg.       A midline incision is made with a ten blade through the subcutaneous tissue to the level of the extensor mechanism. A fresh blade is used to make a medial parapatellar arthrotomy. Soft tissue over the proximal medial tibia is subperiosteally elevated to the joint line with a knife and into the semimembranosus bursa with a Cobb  elevator. Soft tissue over the proximal lateral tibia is elevated with attention being paid to avoiding the patellar tendon on the tibial tubercle. The patella is everted, knee flexed 90 degrees and the ACL and PCL are removed. Findings are bone on bone lateral and patellofemoral with large global osteophytes        The drill is used to create a starting hole in the distal femur and the canal is thoroughly irrigated with sterile saline to remove the fatty contents. The 5 degree Right  valgus alignment guide is placed into the femoral canal and the distal femoral cutting block is pinned to remove 9 mm off the distal femur. Resection is made with an oscillating saw.      The tibia is subluxed forward and the menisci are removed. The extramedullary alignment guide is placed referencing proximally at the medial aspect of the tibial tubercle and distally along the second metatarsal axis and tibial crest. The block is pinned to remove 83mm off the more deficient lateral  side. Resection is made with an oscillating saw. Size 6is the most appropriate size for the tibia and the proximal tibia is prepared with the modular drill and keel punch for that size.      The femoral sizing guide is placed and size 6 is most appropriate. Rotation is marked off the epicondylar axis and confirmed by creating a rectangular flexion gap at 90 degrees. The size 6 cutting block is pinned in this rotation and the anterior, posterior and chamfer cuts are made with the oscillating saw. The intercondylar block is then placed and that cut is  made.      Trial size 6 tibial component, trial size 6 posterior stabilized femur and a 7  mm posterior stabilized rotating platform insert trial is placed. Full extension is achieved with excellent varus/valgus and anterior/posterior balance throughout full range of motion. The patella is everted and thickness measured to be 24  mm. Free hand resection is taken to 14 mm, a 38 template is placed, lug holes  are drilled, trial patella is placed, and it tracks normally. Osteophytes are removed off the posterior femur with the trial in place. All trials are removed and the cut bone surfaces prepared with pulsatile lavage. Cement is mixed and once ready for implantation, the size 6 tibial implant, size  6 posterior stabilized femoral component, and the size 38 patella are cemented in place and the patella is held with the clamp. The trial insert is placed and the knee held in full extension. The Exparel (20 ml mixed with 60 ml saline) is injected into the extensor mechanism, posterior capsule, medial and lateral gutters and subcutaneous tissues.  All extruded cement is removed and once the cement is hard the permanent 7 mm posterior stabilized rotating platform insert is placed into the tibial tray.      The wound is copiously irrigated with saline solution and the extensor mechanism closed with # 0 Stratofix suture. The tourniquet is released for a total tourniquet time of 32  minutes. Flexion against gravity is 120 degrees and the patella tracks normally. Subcutaneous tissue is closed with 2.0 vicryl and subcuticular with running 4.0 Monocryl. The incision is cleaned and dried and steri-strips and a bulky sterile dressing are applied. The limb is placed into a knee immobilizer and the patient is awakened and transported to recovery in stable condition.      Please note that a surgical assistant was a medical necessity for this procedure in order to perform it in a safe and expeditious manner. Surgical assistant was necessary to retract the ligaments and vital neurovascular structures to prevent injury to them and also necessary for proper positioning of the limb to allow for anatomic placement of the prosthesis.   Dione Plover Furman Trentman, MD    02/17/2023, 12:34 PM

## 2023-02-17 NOTE — Plan of Care (Signed)
  Problem: Education: Goal: Knowledge of the prescribed therapeutic regimen will improve Outcome: Progressing   Problem: Activity: Goal: Ability to avoid complications of mobility impairment will improve Outcome: Progressing   Problem: Pain Management: Goal: Pain level will decrease with appropriate interventions Outcome: Progressing   Problem: Education: Goal: Knowledge of General Education information will improve Description: Including pain rating scale, medication(s)/side effects and non-pharmacologic comfort measures Outcome: Progressing   Problem: Activity: Goal: Risk for activity intolerance will decrease Outcome: Progressing   Problem: Nutrition: Goal: Adequate nutrition will be maintained Outcome: Progressing   Problem: Elimination: Goal: Will not experience complications related to bowel motility Outcome: Progressing   Problem: Pain Managment: Goal: General experience of comfort will improve Outcome: Progressing   Problem: Safety: Goal: Ability to remain free from injury will improve Outcome: Progressing   

## 2023-02-17 NOTE — Anesthesia Preprocedure Evaluation (Addendum)
Anesthesia Evaluation  Patient identified by MRN, date of birth, ID band Patient awake    Reviewed: Allergy & Precautions, H&P , NPO status , Patient's Chart, lab work & pertinent test results, reviewed documented beta blocker date and time   Airway Mallampati: II  TM Distance: >3 FB Neck ROM: Full    Dental no notable dental hx. (+) Poor Dentition, Dental Advisory Given   Pulmonary neg pulmonary ROS   Pulmonary exam normal breath sounds clear to auscultation       Cardiovascular hypertension, Pt. on medications and Pt. on home beta blockers  Rhythm:Regular Rate:Normal     Neuro/Psych   Anxiety Depression    TIA   GI/Hepatic negative GI ROS, Neg liver ROS,,,  Endo/Other  negative endocrine ROS    Renal/GU negative Renal ROS  negative genitourinary   Musculoskeletal  (+) Arthritis , Osteoarthritis,    Abdominal   Peds  Hematology negative hematology ROS (+)   Anesthesia Other Findings   Reproductive/Obstetrics negative OB ROS                             Anesthesia Physical Anesthesia Plan  ASA: 2  Anesthesia Plan: Spinal   Post-op Pain Management: Regional block* and Tylenol PO (pre-op)*   Induction: Intravenous  PONV Risk Score and Plan: 2 and Ondansetron, Propofol infusion and Treatment may vary due to age or medical condition  Airway Management Planned: Natural Airway and Simple Face Mask  Additional Equipment:   Intra-op Plan:   Post-operative Plan:   Informed Consent: I have reviewed the patients History and Physical, chart, labs and discussed the procedure including the risks, benefits and alternatives for the proposed anesthesia with the patient or authorized representative who has indicated his/her understanding and acceptance.     Dental advisory given  Plan Discussed with: CRNA  Anesthesia Plan Comments:        Anesthesia Quick Evaluation

## 2023-02-17 NOTE — Care Plan (Signed)
Ortho Bundle Case Management Note  Patient Details  Name: Luis Kim MRN: [id number] Date of Birth: [birth year]                  R TKA on 02/17/23. DCP: Home with wife.  DME: No needs. Has RW and cane.  PT: [facility] 3/29. Referral faxed.   DME Arranged:  N/A DME Agency:       Additional Comments: Please contact me with any questions of if this plan should need to change.    Dario Ave, Case Manager  [facility]  [phone] 02/17/2023, 10:52 AM

## 2023-02-17 NOTE — Progress Notes (Signed)
Orthopedic Tech Progress Note Patient Details:  Luis Kim 02-21-1946 [id number]  CPM Right Knee CPM Right Knee: Off Right Knee Flexion (Degrees): 40 Right Knee Extension (Degrees): 10 Additional Comments: [id number]  Post Interventions Patient Tolerated: Well Instructions Provided: Care of device  [name] 02/17/2023, 5:05 PM

## 2023-02-17 NOTE — Interval H&P Note (Signed)
History and Physical Interval Note:  02/17/2023 9:40 AM  Luis Kim  has presented today for surgery, with the diagnosis of right knee osteoarthritis.  The various methods of treatment have been discussed with the patient and family. After consideration of risks, benefits and other options for treatment, the patient has consented to  Procedure(s): TOTAL KNEE ARTHROPLASTY (Right) as a surgical intervention.  The patient's history has been reviewed, patient examined, no change in status, stable for surgery.  I have reviewed the patient's chart and labs.  Questions were answered to the patient's satisfaction.     Pilar Plate Suraya Vidrine

## 2023-02-17 NOTE — Anesthesia Postprocedure Evaluation (Signed)
Anesthesia Post Note  Patient: Luis Kim  Procedure(s) Performed: TOTAL KNEE ARTHROPLASTY (Right: Knee)     Patient location during evaluation: PACU Anesthesia Type: Spinal and Regional Level of consciousness: oriented and awake and alert Pain management: pain level controlled Vital Signs Assessment: post-procedure vital signs reviewed and stable Respiratory status: spontaneous breathing and respiratory function stable Cardiovascular status: blood pressure returned to baseline and stable Postop Assessment: no headache, no backache, no apparent nausea or vomiting, spinal receding and patient able to bend at knees Anesthetic complications: no  No notable events documented.  Last Vitals:  Vitals:   02/17/23 1500 02/17/23 1515  BP: (!) 150/75 (!) 175/83  Pulse: 65 68  Resp: (!) 21 14  Temp:  36.7 C  SpO2: 99% 100%    Last Pain:  Vitals:   02/17/23 1445  TempSrc:   PainSc: 0-No pain                 Akisha Sturgill,W. EDMOND

## 2023-02-17 NOTE — Transfer of Care (Signed)
Immediate Anesthesia Transfer of Care Note  Patient: Luis Kim  Procedure(s) Performed: TOTAL KNEE ARTHROPLASTY (Right: Knee)  Patient Location: PACU  Anesthesia Type:Spinal  Level of Consciousness: sedated  Airway & Oxygen Therapy: Patient Spontanous Breathing and Patient connected to face mask oxygen  Post-op Assessment: Report given to RN and Post -op Vital signs reviewed and stable  Post vital signs: Reviewed and stable  Last Vitals:  Vitals Value Taken Time  BP    Temp    Pulse 66 02/17/23 1258  Resp 14 02/17/23 1258  SpO2 100 % 02/17/23 1258  Vitals shown include unvalidated device data.  Last Pain:  Vitals:   02/17/23 1046  TempSrc:   PainSc: 0-No pain      Patients Stated Pain Goal: 2 ([id number])  Complications: No notable events documented.

## 2023-02-17 NOTE — Discharge Instructions (Signed)
Luis Arabian, MD Total Joint Specialist [facility] [place] [address], [address] [place], [place] [address] [phone]  TOTAL KNEE REPLACEMENT POSTOPERATIVE DIRECTIONS    Knee Rehabilitation, Guidelines Following Surgery  Results after knee surgery are often greatly improved when you follow the exercise, range of motion and muscle strengthening exercises prescribed by your doctor. Safety measures are also important to protect the knee from further injury. If any of these exercises cause you to have increased pain or swelling in your knee joint, decrease the amount until you are comfortable again and slowly increase them. If you have problems or questions, call your caregiver or physical therapist for advice.   BLOOD CLOT PREVENTION Take an 81 mg Aspirin two times a day for three weeks following surgery. Then take an 81 mg Aspirin once a day for three weeks. Then discontinue Aspirin. You may resume your vitamins/supplements upon discharge from the hospital. Do not take any NSAIDs (Advil, Aleve, Ibuprofen, Meloxicam, etc.) until you are 3 weeks out from surgery  [place] items at home which could result in a fall. This includes throw rugs or furniture in walking pathways.  ICE to the affected knee as much as tolerated. Icing helps control swelling. If the swelling is well controlled you will be more comfortable and rehab easier. Continue to use ice on the knee for pain and swelling from surgery. You may notice swelling that will progress down to the foot and ankle. This is normal after surgery. Elevate the leg when you are not up walking on it.    Continue to use the breathing machine which will help keep your temperature down. It is common for your temperature to cycle up and down following surgery, especially at night when you are not up moving around and exerting yourself. The breathing machine keeps your lungs expanded and your temperature down. Do  not place pillow under the operative knee, focus on keeping the knee straight while resting  DIET You may resume your previous home diet once you are discharged from the hospital.  DRESSING / WOUND CARE / SHOWERING Keep your bulky bandage on for 2 days. On the third post-operative day you may remove the Ace bandage and gauze. There is a waterproof adhesive bandage on your skin which will stay in place until your first follow-up appointment. Once you remove this you will not need to place another bandage You may begin showering 3 days following surgery, but do not submerge the incision under water.  ACTIVITY For the first 5 days, the key is rest and control of pain and swelling Do your home exercises twice a day starting on post-operative day 3. On the days you go to physical therapy, just do the home exercises once that day. You should rest, ice and elevate the leg for 50 minutes out of every hour. Get up and walk/stretch for 10 minutes per hour. After 5 days you can increase your activity slowly as tolerated. Walk with your walker as instructed. Use the walker until you are comfortable transitioning to a cane. Walk with the cane in the opposite hand of the operative leg. You may discontinue the cane once you are comfortable and walking steadily. Avoid periods of inactivity such as sitting longer than an hour when not asleep. This helps prevent blood clots.  You may discontinue the knee immobilizer once you are able to perform a straight leg raise while lying down. You may resume a sexual relationship in one month  or when given the OK by your doctor.  You may return to work once you are cleared by your doctor.  Do not drive a car for 6 weeks or until released by your surgeon.  Do not drive while taking narcotics.  TED HOSE STOCKINGS Wear the elastic stockings on both legs for three weeks following surgery during the day. You may remove them at night for sleeping.  WEIGHT BEARING Weight  bearing as tolerated with assist device (walker, cane, etc) as directed, use it as long as suggested by your surgeon or therapist, typically at least 4-6 weeks.  POSTOPERATIVE CONSTIPATION PROTOCOL Constipation - defined medically as fewer than three stools per week and severe constipation as less than one stool per week.  One of the most common issues patients have following surgery is constipation.  Even if you have a regular bowel pattern at home, your normal regimen is likely to be disrupted due to multiple reasons following surgery.  Combination of anesthesia, postoperative narcotics, change in appetite and fluid intake all can affect your bowels.  In order to avoid complications following surgery, here are some recommendations in order to help you during your recovery period.  Colace (docusate) - Pick up an over-the-counter form of Colace or another stool softener and take twice a day as long as you are requiring postoperative pain medications.  Take with a full glass of water daily.  If you experience loose stools or diarrhea, hold the colace until you stool forms back up. If your symptoms do not get better within 1 week or if they get worse, check with your doctor. Dulcolax (bisacodyl) - Pick up over-the-counter and take as directed by the product packaging as needed to assist with the movement of your bowels.  Take with a full glass of water.  Use this product as needed if not relieved by Colace only.  MiraLax (polyethylene glycol) - Pick up over-the-counter to have on hand. MiraLax is a solution that will increase the amount of water in your bowels to assist with bowel movements.  Take as directed and can mix with a glass of water, juice, soda, coffee, or tea. Take if you go more than two days without a movement. Do not use MiraLax more than once per day. Call your doctor if you are still constipated or irregular after using this medication for 7 days in a row.  If you continue to have problems  with postoperative constipation, please contact the office for further assistance and recommendations.  If you experience "the worst abdominal pain ever" or develop nausea or vomiting, please contact the office immediatly for further recommendations for treatment.  ITCHING If you experience itching with your medications, try taking only a single pain pill, or even half a pain pill at a time.  You can also use Benadryl over the counter for itching or also to help with sleep.   MEDICATIONS See your medication summary on the "After Visit Summary" that the nursing staff will review with you prior to discharge.  You may have some home medications which will be placed on hold until you complete the course of blood thinner medication.  It is important for you to complete the blood thinner medication as prescribed by your surgeon.  Continue your approved medications as instructed at time of discharge.  PRECAUTIONS If you experience chest pain or shortness of breath - call 911 immediately for transfer to the hospital emergency department.  If you develop a fever greater that 101 F,  purulent drainage from wound, increased redness or drainage from wound, foul odor from the wound/dressing, or calf pain - CONTACT YOUR SURGEON.                                                   FOLLOW-UP APPOINTMENTS Make sure you keep all of your appointments after your operation with your surgeon and caregivers. You should call the office at the above phone number and make an appointment for approximately two weeks after the date of your surgery or on the date instructed by your surgeon outlined in the "After Visit Summary".  RANGE OF MOTION AND STRENGTHENING EXERCISES  Rehabilitation of the knee is important following a knee injury or an operation. After just a few days of immobilization, the muscles of the thigh which control the knee become weakened and shrink (atrophy). Knee exercises are designed to build up the tone and  strength of the thigh muscles and to improve knee motion. Often times heat used for twenty to thirty minutes before working out will loosen up your tissues and help with improving the range of motion but do not use heat for the first two weeks following surgery. These exercises can be done on a training (exercise) mat, on the floor, on a table or on a bed. Use what ever works the best and is most comfortable for you Knee exercises include:  Leg Lifts - While your knee is still immobilized in a splint or cast, you can do straight leg raises. Lift the leg to 60 degrees, hold for 3 sec, and slowly lower the leg. Repeat 10-20 times 2-3 times daily. Perform this exercise against resistance later as your knee gets better.  Quad and Hamstring Sets - Tighten up the muscle on the front of the thigh (Quad) and hold for 5-10 sec. Repeat this 10-20 times hourly. Hamstring sets are done by pushing the foot backward against an object and holding for 5-10 sec. Repeat as with quad sets.  Leg Slides: Lying on your back, slowly slide your foot toward your buttocks, bending your knee up off the floor (only go as far as is comfortable). Then slowly slide your foot back down until your leg is flat on the floor again. [place]: Lying on your back spread your legs to the side as far apart as you can without causing discomfort.  A rehabilitation program following serious knee injuries can speed recovery and prevent re-injury in the future due to weakened muscles. Contact your doctor or a physical therapist for more information on knee rehabilitation.   POST-OPERATIVE OPIOID TAPER INSTRUCTIONS: It is important to wean off of your opioid medication as soon as possible. If you do not need pain medication after your surgery it is ok to stop day one. Opioids include: Codeine, Hydrocodone(Norco, Vicodin), Oxycodone(Percocet, oxycontin) and hydromorphone amongst others.  Long term and even short term use of opiods can  cause: Increased pain response Dependence Constipation Depression Respiratory depression And more.  Withdrawal symptoms can include Flu like symptoms Nausea, vomiting And more Techniques to manage these symptoms Hydrate well Eat regular healthy meals Stay active Use relaxation techniques(deep breathing, meditating, yoga) Do Not substitute Alcohol to help with tapering If you have been on opioids for less than two weeks and do not have pain than it is ok to stop all together.  Plan  to wean off of opioids This plan should start within one week post op of your joint replacement. Maintain the same interval or time between taking each dose and first decrease the dose.  Cut the total daily intake of opioids by one tablet each day Next start to increase the time between doses. The last dose that should be eliminated is the evening dose.   IF YOU ARE TRANSFERRED TO A SKILLED REHAB FACILITY If the patient is transferred to a skilled rehab facility following release from the hospital, a list of the current medications will be sent to the facility for the patient to continue.  When discharged from the skilled rehab facility, please have the facility set up the patient's Home Health Physical Therapy prior to being released. Also, the skilled facility will be responsible for providing the patient with their medications at time of release from the facility to include their pain medication, the muscle relaxants, and their blood thinner medication. If the patient is still at the rehab facility at time of the two week follow up appointment, the skilled rehab facility will also need to assist the patient in arranging follow up appointment in our office and any transportation needs.  MAKE SURE YOU:  Understand these instructions.  Get help right away if you are not doing well or get worse.   DENTAL ANTIBIOTICS:  In most cases prophylactic antibiotics for Dental procdeures after total joint surgery are  not necessary.  Exceptions are as follows:  1. History of prior total joint infection  2. Severely immunocompromised (Organ Transplant, cancer chemotherapy, Rheumatoid biologic meds such as Humera)  3. Poorly controlled diabetes (A1C &gt; 8.0, blood glucose over 200)  If you have one of these conditions, contact your surgeon for an antibiotic prescription, prior to your dental procedure.    Pick up stool softner and laxative for home use following surgery while on pain medications. Do not submerge incision under water. Please use good hand washing techniques while changing dressing each day. May shower starting three days after surgery. Please use a clean towel to pat the incision dry following showers. Continue to use ice for pain and swelling after surgery. Do not use any lotions or creams on the incision until instructed by your surgeon.  

## 2023-02-17 NOTE — Evaluation (Signed)
Physical Therapy Evaluation Patient Details Name: Luis Kim MRN: [id number] DOB: [birth year] Today's Date: 02/17/2023  History of Present Illness  77 yo male presents to therapy s/p R TKA on 02/17/2023 due to failure of conservative measures. Pt PMH includes but is not limited to: HTN, ETOH abuse, DM II, DOE, [place] tick fever, hernia repair, R knee sx x 3.  Clinical Impression    Luis Kim is a 77 y.o. male POD 0 s/p R TKA. Patient reports mod I with mobility at baseline. Patient is now limited by functional impairments (see PT problem list below) and requires min guard for bed mobility and mod A for transfers. Patient was able to ambulate 30 feet with RW and min guard level of assist. Patient instructed in exercise to facilitate ROM and circulation to manage edema. Patient will benefit from continued skilled PT interventions to address impairments and progress towards PLOF. Acute PT will follow to progress mobility in preparation for safe discharge home.      Recommendations for follow up therapy are one component of a multi-disciplinary discharge planning process, led by the attending physician.  Recommendations may be updated based on patient status, additional functional criteria and insurance authorization.  Follow Up Recommendations       Assistance Recommended at Discharge Frequent or constant Supervision/Assistance  Patient can return home with the following  A little help with walking and/or transfers;A little help with bathing/dressing/bathroom;Assistance with cooking/housework;Assist for transportation;Help with stairs or ramp for entrance    Equipment Recommendations None recommended by PT (pt reports DME in home setting)  Recommendations for Other Services       Functional Status Assessment Patient has had a recent decline in their functional status and demonstrates the ability to make significant improvements in function in a reasonable and predictable  amount of time.     Precautions / Restrictions Precautions Precautions: Knee;Fall Restrictions Weight Bearing Restrictions: No      Mobility  Bed Mobility Overal bed mobility: Needs Assistance Bed Mobility: Supine to Sit     Supine to sit: Min guard     General bed mobility comments: HOB elevated and use of L bed rail    Transfers Overall transfer level: Needs assistance Equipment used: Rolling walker (2 wheels) Transfers: Sit to/from Stand Sit to Stand: Mod assist           General transfer comment: cues for proper UE placement, power up and extension posture once in standing. pt indicated L LE instabiltiy as well as R LE    Ambulation/Gait Ambulation/Gait assistance: Min guard Gait Distance (Feet): 30 Feet Assistive device: Rolling walker (2 wheels) Gait Pattern/deviations: Step-to pattern, Shuffle, Antalgic, Trunk flexed       General Gait Details: encouragement for gait with slow cadence and flexed posture no evidence of B LE instabiltiy with gait, quick to fatigue  Stairs            Wheelchair Mobility    Modified Rankin (Stroke Patients Only)       Balance Overall balance assessment: Needs assistance Sitting-balance support: Feet supported Sitting balance-Leahy Scale: Fair     Standing balance support: Bilateral upper extremity supported, During functional activity Standing balance-Leahy Scale: Poor                               Pertinent Vitals/Pain Pain Assessment Pain Assessment: 0-10 Pain Score: 6  Pain Location: R knee Pain Descriptors / Indicators:  Aching, Constant, Discomfort, Operative site guarding Pain Intervention(s): Limited activity within patient's tolerance, Monitored during session, Ice applied    Home Living Family/patient expects to be discharged to:: Private residence Living Arrangements: Spouse/significant other Available Help at Discharge: Family Type of Home: House Home Access: Ramped entrance        Home Layout: One level Home Equipment: Conservation officer, nature (2 wheels);Cane - single point;Wheelchair Probation officer (4 wheels);BSC/3in1;Shower seat      Prior Function Prior Level of Function : Independent/Modified Independent             Mobility Comments: mod I at RW or SPC level for all ADLs, self care tasks, IADLs and driving       Hand Dominance        Extremity/Trunk Assessment        Lower Extremity Assessment Lower Extremity Assessment: RLE deficits/detail RLE Deficits / Details: ankle DF 4+/5, PF 5/5, SLR > 10 degree lag RLE Sensation: WNL       Communication   Communication: No difficulties  Cognition Arousal/Alertness: Awake/alert Behavior During Therapy: WFL for tasks assessed/performed Overall Cognitive Status: Within Functional Limits for tasks assessed                                          General Comments      Exercises Total Joint Exercises Ankle Circles/Pumps: AROM, Both, 20 reps   Assessment/Plan    PT Assessment Patient needs continued PT services  PT Problem List Decreased strength;Decreased range of motion;Decreased activity tolerance;Decreased balance;Decreased mobility;Decreased coordination;Decreased knowledge of use of DME;Pain       PT Treatment Interventions DME instruction;Gait training;Therapeutic activities;Functional mobility training;Therapeutic exercise;Balance training;Neuromuscular re-education;Patient/family education;Modalities    PT Goals (Current goals can be found in the Care Plan section)  Acute Rehab PT Goals Patient Stated Goal: pt reports wanting to get back out in the yard PT Goal Formulation: With patient Time For Goal Achievement: 03/03/23 Potential to Achieve Goals: Good    Frequency 7X/week     Co-evaluation               AM-PAC PT "6 Clicks" Mobility  Outcome Measure Help needed turning from your back to your side while in a flat bed without using bedrails?: A  Little Help needed moving from lying on your back to sitting on the side of a flat bed without using bedrails?: A Little Help needed moving to and from a bed to a chair (including a wheelchair)?: A Lot Help needed standing up from a chair using your arms (e.g., wheelchair or bedside chair)?: A Lot Help needed to walk in hospital room?: A Little Help needed climbing 3-5 steps with a railing? : Total 6 Click Score: 14    End of Session Equipment Utilized During Treatment: Gait belt Activity Tolerance: Patient limited by fatigue;Patient limited by pain Patient left: in chair;with call bell/phone within reach;with chair alarm set;with family/visitor present Nurse Communication: Mobility status;Patient requests pain meds PT Visit Diagnosis: Unsteadiness on feet (R26.81);Other abnormalities of gait and mobility (R26.89);Muscle weakness (generalized) (M62.81);Pain;Difficulty in walking, not elsewhere classified (R26.2);Repeated falls (R29.6) (2 falls on 3/23 due to R knee instability) Pain - Right/Left: Right Pain - part of body: Knee    Time: [id number] PT Time Calculation (min) (ACUTE ONLY): 34 min   Charges:   PT Evaluation $PT Eval Low Complexity: 1 Low PT Treatments $Gait Training: 8-22  mins       Baird Lyons, PT   Ad[facility] Patter 02/17/2023, 6:18 PM

## 2023-02-17 NOTE — Anesthesia Procedure Notes (Signed)
Anesthesia Regional Block: Adductor canal block   Pre-Anesthetic Checklist: , timeout performed,  Correct Patient, Correct Site, Correct Laterality,  Correct Procedure, Correct Position, site marked,  Risks and benefits discussed,  Pre-op evaluation,  At surgeon's request and post-op pain management  Laterality: Right  Prep: Maximum Sterile Barrier Precautions used, chloraprep       Needles:  Injection technique: Single-shot  Needle Type: Echogenic Stimulator Needle     Needle Length: 9cm  Needle Gauge: 21     Additional Needles:   Procedures:,,,, ultrasound used (permanent image in chart),,    Narrative:  Start time: 02/17/2023 9:50 AM End time: 02/17/2023 10:00 AM Injection made incrementally with aspirations every 5 mL.  Performed by: Personally  Anesthesiologist: Roderic Palau, MD

## 2023-02-17 NOTE — Progress Notes (Signed)
Orthopedic Tech Progress Note Patient Details:  Luis Kim 1946-04-09 [id number]  CPM Right Knee CPM Right Knee: On Right Knee Flexion (Degrees): 40 Right Knee Extension (Degrees): 10  Post Interventions Patient Tolerated: Well Instructions Provided: Care of device  [name] 02/17/2023, 1:38 PM

## 2023-02-18 ENCOUNTER — Encounter (HOSPITAL_COMMUNITY): Payer: Self-pay | Admitting: Orthopedic Surgery

## 2023-02-18 DIAGNOSIS — F32A Depression, unspecified: Secondary | ICD-10-CM | POA: Diagnosis present

## 2023-02-18 DIAGNOSIS — M1710 Unilateral primary osteoarthritis, unspecified knee: Secondary | ICD-10-CM | POA: Diagnosis present

## 2023-02-18 DIAGNOSIS — Z8249 Family history of ischemic heart disease and other diseases of the circulatory system: Secondary | ICD-10-CM | POA: Diagnosis not present

## 2023-02-18 DIAGNOSIS — Z79899 Other long term (current) drug therapy: Secondary | ICD-10-CM | POA: Diagnosis not present

## 2023-02-18 DIAGNOSIS — F419 Anxiety disorder, unspecified: Secondary | ICD-10-CM | POA: Diagnosis present

## 2023-02-18 DIAGNOSIS — E785 Hyperlipidemia, unspecified: Secondary | ICD-10-CM | POA: Diagnosis present

## 2023-02-18 DIAGNOSIS — I10 Essential (primary) hypertension: Secondary | ICD-10-CM | POA: Diagnosis present

## 2023-02-18 DIAGNOSIS — Z8673 Personal history of transient ischemic attack (TIA), and cerebral infarction without residual deficits: Secondary | ICD-10-CM | POA: Diagnosis not present

## 2023-02-18 DIAGNOSIS — E119 Type 2 diabetes mellitus without complications: Secondary | ICD-10-CM | POA: Diagnosis present

## 2023-02-18 DIAGNOSIS — M1711 Unilateral primary osteoarthritis, right knee: Secondary | ICD-10-CM | POA: Diagnosis present

## 2023-02-18 LAB — CBC
HCT: 29.3 % — ABNORMAL LOW (ref 39.0–52.0)
Hemoglobin: 9.8 g/dL — ABNORMAL LOW (ref 13.0–17.0)
MCH: 33.7 pg (ref 26.0–34.0)
MCHC: 33.4 g/dL (ref 30.0–36.0)
MCV: 100.7 fL — ABNORMAL HIGH (ref 80.0–100.0)
Platelets: 144 10*3/uL — ABNORMAL LOW (ref 150–400)
RBC: 2.91 MIL/uL — ABNORMAL LOW (ref 4.22–5.81)
RDW: 13.6 % (ref 11.5–15.5)
WBC: 12.9 10*3/uL — ABNORMAL HIGH (ref 4.0–10.5)
nRBC: 0 % (ref 0.0–0.2)

## 2023-02-18 LAB — BASIC METABOLIC PANEL
Anion gap: 7 (ref 5–15)
BUN: 12 mg/dL (ref 8–23)
CO2: 23 mmol/L (ref 22–32)
Calcium: 7.7 mg/dL — ABNORMAL LOW (ref 8.9–10.3)
Chloride: 104 mmol/L (ref 98–111)
Creatinine, Ser: 0.69 mg/dL (ref 0.61–1.24)
GFR, Estimated: >60 mL/min (ref >60)
Glucose, Bld: 157 mg/dL — ABNORMAL HIGH (ref 70–99)
Potassium: 3.7 mmol/L (ref 3.5–5.1)
Sodium: 134 mmol/L — ABNORMAL LOW (ref 135–145)

## 2023-02-18 MED ORDER — HYDRALAZINE HCL 20 MG/ML IJ SOLN
10.0000 mg | Freq: Four times a day (QID) | INTRAMUSCULAR | Status: DC | PRN
  Administered 2023-02-18: 10 mg via INTRAVENOUS
  Filled 2023-02-18: qty 1

## 2023-02-18 MED ORDER — CALCIUM CARBONATE ANTACID 500 MG PO CHEW
1.0000 | CHEWABLE_TABLET | Freq: Three times a day (TID) | ORAL | Status: DC | PRN
  Administered 2023-02-18: 200 mg via ORAL
  Filled 2023-02-18: qty 1

## 2023-02-18 NOTE — Progress Notes (Signed)
   Subjective: 1 Day Post-Op Procedure(s) (LRB): TOTAL KNEE ARTHROPLASTY (Right) Patient reports pain as moderate.   Patient seen in rounds by Dr. Wynelle Link. Patient is having issues with pain control unfortunately, which was to be expected. On chronic pain management, it was discussed with patient prior to surgery that this would complicate pain control following surgery. Denies chest pain or SOB. No issues overnight.  We will continue therapy today, ambulated 30' yesterday.  Objective: Vital signs in last 24 hours: Temp:  [97.5 F (36.4 C)-99.4 F (37.4 C)] 99.4 F (37.4 C) (03/26 0612) Pulse Rate:  [54-88] 81 (03/26 0612) Resp:  [12-21] 18 (03/26 0612) BP: (119-205)/(68-86) 169/79 (03/26 0612) SpO2:  [96 %-100 %] 98 % (03/26 0612) Weight:  [70.3 kg] 70.3 kg (03/25 0913)  Intake/Output from previous day:  Intake/Output Summary (Last 24 hours) at 02/18/2023 0751 Last data filed at 02/18/2023 0559 Gross per 24 hour  Intake 3428.64 ml  Output 780 ml  Net 2648.64 ml     Intake/Output this shift: No intake/output data recorded.  Labs: Recent Labs    02/18/23 0344  HGB 9.8*   Recent Labs    02/18/23 0344  WBC 12.9*  RBC 2.91*  HCT 29.3*  PLT 144*   Recent Labs    02/18/23 0344  NA 134*  K 3.7  CL 104  CO2 23  BUN 12  CREATININE 0.69  GLUCOSE 157*  CALCIUM 7.7*   No results for input(s): "LABPT", "INR" in the last 72 hours.  Exam: General - Patient is Alert and Oriented Extremity - Neurologically intact Neurovascular intact Sensation intact distally Dorsiflexion/Plantar flexion intact Dressing - dressing C/D/I Motor Function - intact, moving foot and toes well on exam.   Past Medical History:  Diagnosis Date   Anxiety    Arthritis    Depression    Hyperlipidemia    Hypertension    [place] tick fever 03/25/2014   positive    Stroke ([place])    tia-no residual   Wears glasses    Wears partial dentures     Assessment/Plan: 1 Day Post-Op  Procedure(s) (LRB): TOTAL KNEE ARTHROPLASTY (Right) Principal Problem:   OA (osteoarthritis) of knee Active Problems:   Primary osteoarthritis of right knee  Estimated body mass index is 25.79 kg/m as calculated from the following:   Height as of this encounter: 5\' 5"  (1.651 m).   Weight as of this encounter: 70.3 kg. Advance diet Up with therapy   Patient's anticipated LOS is less than 2 midnights, meeting these requirements: - Lives within 1 hour of care - Has a competent adult at home to recover with post-op recover - NO history of  - Diabetes  - Coronary Artery Disease  - Heart failure  - Heart attack  - DVT/VTE  - Cardiac arrhythmia  - Respiratory Failure/COPD  - Renal failure  - Anemia  - Advanced Liver disease  DVT Prophylaxis - Aspirin Weight bearing as tolerated. Continue therapy.  Plan is to go Home after hospital stay. Possible discharge this afternoon if meeting goals and is under good control. Will more than likely stay until tomorrow however.  Scheduled for OPPT at [facility] in [facility], [place] [facility] [phone] 02/18/2023, 7:51 AM

## 2023-02-18 NOTE — TOC Transition Note (Signed)
Transition of Care [facility]) - CM/SW Discharge Note  Patient Details  Name: Luis Kim MRN: [id number] Date of Birth: Feb 11, 1946  [facility] CM/SW Contact:  Sherie Don, LCSW Phone Number: 02/18/2023, 1:44 PM  Clinical Narrative: Patient is expected to discharge home after working with PT. CSW spoke with patient to confirm discharge plan. Patient will go home with OPPT at [facility]. Patient has a rolling walker and cane at home, so there are no DME needs at this time. TOC signing off.    Final next level of care: OP Rehab Barriers to Discharge: No Barriers Identified  Patient Goals and CMS Choice Choice offered to / list presented to : NA  Discharge Plan and Services Additional resources added to the After Visit Summary for           DME Arranged: N/A DME Agency: NA  Social Determinants of Health (SDOH) Interventions SDOH Screenings   Food Insecurity: No Food Insecurity (02/17/2023)  Housing: Low Risk  (02/17/2023)  Transportation Needs: No Transportation Needs (02/17/2023)  Utilities: Not At Risk (02/17/2023)  Tobacco Use: High Risk (02/17/2023)   Readmission Risk Interventions     No data to display

## 2023-02-18 NOTE — Progress Notes (Signed)
Physical Therapy Treatment Patient Details Name: Luis Kim MRN: [id number] DOB: 16-Nov-1946 Today's Date: 02/18/2023   History of Present Illness 77 yo male presents to therapy s/p R TKA on 02/17/2023 due to failure of conservative measures. Pt PMH includes but is not limited to: chronic pain management,  HTN, ETOH abuse, DM II, DOE, [place] tick fever, hernia repair, R knee sx x 3.    PT Comments     Pt limited by pain.  He was able to ambulate 21' but with antalgic pattern and small step length.  Performed limited exercises focused on loosening knee prior to walking with max AAROM for pain control. Pt returned to bed with ice packs in place.  Continue to progress as pain allows.    Recommendations for follow up therapy are one component of a multi-disciplinary discharge planning process, led by the attending physician.  Recommendations may be updated based on patient status, additional functional criteria and insurance authorization.  Follow Up Recommendations       Assistance Recommended at Discharge Frequent or constant Supervision/Assistance  Patient can return home with the following A little help with walking and/or transfers;A little help with bathing/dressing/bathroom;Assistance with cooking/housework;Assist for transportation;Help with stairs or ramp for entrance   Equipment Recommendations  None recommended by PT    Recommendations for Other Services       Precautions / Restrictions Precautions Precautions: Knee;Fall Restrictions Weight Bearing Restrictions: Yes RLE Weight Bearing: Weight bearing as tolerated     Mobility  Bed Mobility Overal bed mobility: Needs Assistance Bed Mobility: Supine to Sit, Sit to Supine     Supine to sit: Min assist Sit to supine: Min assist   General bed mobility comments: Min A for R LE due to pain    Transfers Overall transfer level: Needs assistance Equipment used: Rolling walker (2 wheels) Transfers: Sit to/from  Stand Sit to Stand: Min guard           General transfer comment: Cues for hand placment and R LE managment    Ambulation/Gait Ambulation/Gait assistance: Min guard Gait Distance (Feet): 40 Feet Assistive device: Rolling walker (2 wheels) Gait Pattern/deviations: Step-to pattern, Antalgic, Trunk flexed, Decreased stance time - right, Decreased weight shift to right, Decreased stride length Gait velocity: decreased     General Gait Details: Cues for smaller steps for pain control   Stairs             Wheelchair Mobility    Modified Rankin (Stroke Patients Only)       Balance Overall balance assessment: Needs assistance Sitting-balance support: Feet supported Sitting balance-Leahy Scale: Good     Standing balance support: Bilateral upper extremity supported, During functional activity, Reliant on assistive device for balance Standing balance-Leahy Scale: Poor Standing balance comment: Required RW; steady with RW                            Cognition Arousal/Alertness: Awake/alert Behavior During Therapy: WFL for tasks assessed/performed Overall Cognitive Status: Within Functional Limits for tasks assessed                                          Exercises Total Joint Exercises Ankle Circles/Pumps: AROM, Both, 15 reps, Supine Long Arc Quad: AAROM, Right, Seated, 10 reps (very gentle ROM with max AAROm for pain control) Knee Flexion: AAROM, Seated,  Right, 15 reps (Very gentle ROM with max AAROM for pain control) Goniometric ROM: R knee ~10 to 70degrees    General Comments General comments (skin integrity, edema, etc.): VSS      Pertinent Vitals/Pain Pain Assessment Pain Assessment: 0-10 Pain Score: 8  Pain Location: R knee Pain Descriptors / Indicators: Aching, Constant, Discomfort, Operative site guarding Pain Intervention(s): Limited activity within patient's tolerance, Monitored during session, Premedicated before  session, Repositioned, Ice applied, Patient requesting pain meds-RN notified    Home Living                          Prior Function            PT Goals (current goals can now be found in the care plan section) Progress towards PT goals: Progressing toward goals    Frequency    7X/week      PT Plan Current plan remains appropriate    Co-evaluation              AM-PAC PT "6 Clicks" Mobility   Outcome Measure  Help needed turning from your back to your side while in a flat bed without using bedrails?: A Little Help needed moving from lying on your back to sitting on the side of a flat bed without using bedrails?: A Little Help needed moving to and from a bed to a chair (including a wheelchair)?: A Little Help needed standing up from a chair using your arms (e.g., wheelchair or bedside chair)?: A Little Help needed to walk in hospital room?: A Little Help needed climbing 3-5 steps with a railing? : A Lot 6 Click Score: 17    End of Session Equipment Utilized During Treatment: Gait belt Activity Tolerance: Patient limited by pain Patient left: with call bell/phone within reach;in bed;with bed alarm set Nurse Communication: Mobility status;Patient requests pain meds PT Visit Diagnosis: Other abnormalities of gait and mobility (R26.89);Muscle weakness (generalized) (M62.81);Repeated falls (R29.6) Pain - Right/Left: Right Pain - part of body: Knee     Time: 1440-1459 PT Time Calculation (min) (ACUTE ONLY): 19 min  Charges:  $Gait Training: 8-22 mins                     Abran Richard, PT Acute Rehab [facility] 02/18/2023, 3:05 PM

## 2023-02-18 NOTE — Progress Notes (Signed)
Physical Therapy Treatment Patient Details Name: Luis Kim MRN: [id number] DOB: Jan 14, 1946 Today's Date: 02/18/2023   History of Present Illness 77 yo male presents to therapy s/p R TKA on 02/17/2023 due to failure of conservative measures. Pt PMH includes but is not limited to: chronic pain management,  HTN, ETOH abuse, DM II, DOE, [place] tick fever, hernia repair, R knee sx x 3.    PT Comments    Pt making gradual progress.  Limited therapeutic exercises for pain control.  Pt able to ambulate 40' but with antalgic pattern and small step length.  Continue to progress as pain allows.     Recommendations for follow up therapy are one component of a multi-disciplinary discharge planning process, led by the attending physician.  Recommendations may be updated based on patient status, additional functional criteria and insurance authorization.  Follow Up Recommendations       Assistance Recommended at Discharge Frequent or constant Supervision/Assistance  Patient can return home with the following A little help with walking and/or transfers;A little help with bathing/dressing/bathroom;Assistance with cooking/housework;Assist for transportation;Help with stairs or ramp for entrance   Equipment Recommendations  None recommended by PT (Has DME)    Recommendations for Other Services       Precautions / Restrictions Precautions Precautions: Knee;Fall Restrictions Weight Bearing Restrictions: Yes RLE Weight Bearing: Weight bearing as tolerated     Mobility  Bed Mobility Overal bed mobility: Needs Assistance Bed Mobility: Supine to Sit     Supine to sit: Supervision          Transfers Overall transfer level: Needs assistance Equipment used: Rolling walker (2 wheels) Transfers: Sit to/from Stand Sit to Stand: Min guard           General transfer comment: Cues for hand placment    Ambulation/Gait Ambulation/Gait assistance: Min guard Gait Distance (Feet):  50 Feet Assistive device: Rolling walker (2 wheels) Gait Pattern/deviations: Step-to pattern, Antalgic, Trunk flexed, Decreased stance time - right, Decreased weight shift to right, Decreased stride length Gait velocity: decreased     General Gait Details: Very small steps wtih antalgic pattern; initial cues for RW proximity; pt with flexed posture (wil need RW height lowered)   Stairs             Wheelchair Mobility    Modified Rankin (Stroke Patients Only)       Balance Overall balance assessment: Needs assistance Sitting-balance support: Feet supported Sitting balance-Leahy Scale: Good     Standing balance support: Bilateral upper extremity supported, During functional activity, Reliant on assistive device for balance Standing balance-Leahy Scale: Poor Standing balance comment: Required RW; steady with RW                            Cognition Arousal/Alertness: Awake/alert Behavior During Therapy: WFL for tasks assessed/performed Overall Cognitive Status: Within Functional Limits for tasks assessed                                          Exercises Total Joint Exercises Ankle Circles/Pumps: AROM, Both, 15 reps, Supine Long Arc Quad: AAROM, Right, 5 reps, Seated (Very gentle ROM wtih assist for pain control) Knee Flexion: AAROM, Right, 5 reps, Seated (Very gentle ROM wtih assist for pain control) Goniometric ROM: R knee ~10 to 70 degrees    General Comments General comments (skin  integrity, edema, etc.): VSS      Pertinent Vitals/Pain Pain Assessment Pain Assessment: 0-10 Pain Score: 6  Pain Location: R knee Pain Descriptors / Indicators: Aching, Constant, Discomfort, Operative site guarding Pain Intervention(s): Limited activity within patient's tolerance, Monitored during session, Premedicated before session    Home Living                          Prior Function            PT Goals (current goals can now be  found in the care plan section) Progress towards PT goals: Progressing toward goals    Frequency    7X/week      PT Plan      Co-evaluation              AM-PAC PT "6 Clicks" Mobility   Outcome Measure  Help needed turning from your back to your side while in a flat bed without using bedrails?: A Little Help needed moving from lying on your back to sitting on the side of a flat bed without using bedrails?: A Little Help needed moving to and from a bed to a chair (including a wheelchair)?: A Little Help needed standing up from a chair using your arms (e.g., wheelchair or bedside chair)?: A Little Help needed to walk in hospital room?: A Little Help needed climbing 3-5 steps with a railing? : A Lot 6 Click Score: 17    End of Session Equipment Utilized During Treatment: Gait belt Activity Tolerance: Patient limited by pain Patient left: in chair;with call bell/phone within reach;with chair alarm set Nurse Communication: Mobility status PT Visit Diagnosis: Other abnormalities of gait and mobility (R26.89);Muscle weakness (generalized) (M62.81);Repeated falls (R29.6)     Time: [id number] PT Time Calculation (min) (ACUTE ONLY): 16 min  Charges:  $Gait Training: 8-22 mins                     Abran Richard, PT Acute Rehab [facility] 02/18/2023, 2:21 PM

## 2023-02-19 ENCOUNTER — Other Ambulatory Visit (HOSPITAL_COMMUNITY): Payer: Self-pay

## 2023-02-19 LAB — CBC
HCT: 24.9 % — ABNORMAL LOW (ref 39.0–52.0)
Hemoglobin: 8.3 g/dL — ABNORMAL LOW (ref 13.0–17.0)
MCH: 34.2 pg — ABNORMAL HIGH (ref 26.0–34.0)
MCHC: 33.3 g/dL (ref 30.0–36.0)
MCV: 102.5 fL — ABNORMAL HIGH (ref 80.0–100.0)
Platelets: 111 10*3/uL — ABNORMAL LOW (ref 150–400)
RBC: 2.43 MIL/uL — ABNORMAL LOW (ref 4.22–5.81)
RDW: 13.7 % (ref 11.5–15.5)
WBC: 10 10*3/uL (ref 4.0–10.5)
nRBC: 0 % (ref 0.0–0.2)

## 2023-02-19 MED ORDER — METHOCARBAMOL 500 MG PO TABS
500.0000 mg | ORAL_TABLET | Freq: Four times a day (QID) | ORAL | 0 refills | Status: AC | PRN
  Filled 2023-02-19: qty 40, 10d supply, fill #0

## 2023-02-19 MED ORDER — HYDROMORPHONE HCL 2 MG PO TABS
2.0000 mg | ORAL_TABLET | Freq: Four times a day (QID) | ORAL | 0 refills | Status: AC | PRN
  Filled 2023-02-19: qty 42, 6d supply, fill #0

## 2023-02-19 MED ORDER — ASPIRIN 81 MG PO CHEW
81.0000 mg | CHEWABLE_TABLET | Freq: Two times a day (BID) | ORAL | 0 refills | Status: AC
  Filled 2023-02-19: qty 38, 19d supply, fill #0

## 2023-02-19 NOTE — Progress Notes (Signed)
Discharge instructions given to patient. Verbalizes understanding.

## 2023-02-19 NOTE — Progress Notes (Signed)
   Subjective: 2 Days Post-Op Procedure(s) (LRB): TOTAL KNEE ARTHROPLASTY (Right) Patient seen in rounds by Dr. Wynelle Link. Patient is well, and has had no acute complaints or problems. No issues overnight. Denies SOB or chest pain. Denies calf pain. Patient reports pain as moderate.  Improving. Worked with physical therapy yesterday and ambulated 31' though limited by pain.  Objective: Vital signs in last 24 hours: Temp:  [98.4 F (36.9 C)-99 F (37.2 C)] 98.4 F (36.9 C) (03/27 0624) Pulse Rate:  [63-83] 63 (03/27 0624) Resp:  [16-18] 17 (03/27 0624) BP: (152-190)/(62-73) 172/62 (03/27 0624) SpO2:  [96 %-99 %] 96 % (03/27 0624)  Intake/Output from previous day:  Intake/Output Summary (Last 24 hours) at 02/19/2023 0732 Last data filed at 02/19/2023 0624 Gross per 24 hour  Intake 984.3 ml  Output 1050 ml  Net -65.7 ml    Intake/Output this shift: No intake/output data recorded.  Labs: Recent Labs    02/18/23 0344 02/19/23 0348  HGB 9.8* 8.3*   Recent Labs    02/18/23 0344 02/19/23 0348  WBC 12.9* 10.0  RBC 2.91* 2.43*  HCT 29.3* 24.9*  PLT 144* 111*   Recent Labs    02/18/23 0344  NA 134*  K 3.7  CL 104  CO2 23  BUN 12  CREATININE 0.69  GLUCOSE 157*  CALCIUM 7.7*   No results for input(s): "LABPT", "INR" in the last 72 hours.  Exam: General - Patient is Alert and Oriented Extremity - Neurologically intact Neurovascular intact Sensation intact distally Dorsiflexion/Plantar flexion intact Dressing/Incision - clean, dry, no drainage Motor Function - intact, moving foot and toes well on exam.  Past Medical History:  Diagnosis Date   Anxiety    Arthritis    Depression    Hyperlipidemia    Hypertension    [place] tick fever 03/25/2014   positive    Stroke ([place])    tia-no residual   Wears glasses    Wears partial dentures     Assessment/Plan: 2 Days Post-Op Procedure(s) (LRB): TOTAL KNEE ARTHROPLASTY (Right) Principal Problem:   OA  (osteoarthritis) of knee Active Problems:   Primary osteoarthritis of right knee   Primary osteoarthritis of knee  Estimated body mass index is 25.79 kg/m as calculated from the following:   Height as of this encounter: 5\' 5"  (1.651 m).   Weight as of this encounter: 70.3 kg.  DVT Prophylaxis - Aspirin Weight-bearing as tolerated.  Continue physical therapy. Expected discharge home today pending progress and if meeting patient goals. Scheduled for OPPT at [facility] in [facility]. Follow-up in clinic in 2 weeks.  The PDMP database was reviewed today prior to any opioid medications being prescribed to this patient.  R. Jaynie Bream, PA-C Orthopedic Surgery 02/19/2023, 7:32 AM

## 2023-02-19 NOTE — Progress Notes (Signed)
Physical Therapy Treatment Patient Details Name: Luis Kim MRN: [id number] DOB: 1946/03/15 Today's Date: 02/19/2023   History of Present Illness 77 yo male presents to therapy s/p R TKA on 02/17/2023 due to failure of conservative measures. Pt PMH includes but is not limited to: chronic pain management,  HTN, ETOH abuse, DM II, DOE, [place] tick fever, hernia repair, R knee sx x 3.    PT Comments    Pt reports pain is a little better than on yesterday. Moderate pain with activity. Will plan to have a 2nd session prior to d/c home later today if pain remains controlled.    Recommendations for follow up therapy are one component of a multi-disciplinary discharge planning process, led by the attending physician.  Recommendations may be updated based on patient status, additional functional criteria and insurance authorization.  Follow Up Recommendations       Assistance Recommended at Discharge Frequent or constant Supervision/Assistance  Patient can return home with the following A little help with walking and/or transfers;A little help with bathing/dressing/bathroom;Assistance with cooking/housework;Assist for transportation;Help with stairs or ramp for entrance   Equipment Recommendations  None recommended by PT    Recommendations for Other Services       Precautions / Restrictions Precautions Precautions: Fall;Knee Restrictions Weight Bearing Restrictions: No RLE Weight Bearing: Weight bearing as tolerated     Mobility  Bed Mobility Overal bed mobility: Needs Assistance Bed Mobility: Supine to Sit, Sit to Supine     Supine to sit: Min guard, HOB elevated Sit to supine: Min guard, HOB elevated   General bed mobility comments: Min guard A for safety. Pt used gait belt as leg lifter to A LE on/off bed. Increased time. Cues provided.    Transfers Overall transfer level: Needs assistance Equipment used: Rolling walker (2 wheels) Transfers: Sit to/from  Stand Sit to Stand: Min guard           General transfer comment: Cues for hand placment and R LE managment.    Ambulation/Gait Ambulation/Gait assistance: Min guard Gait Distance (Feet): 50 Feet Assistive device: Rolling walker (2 wheels) Gait Pattern/deviations: Step-to pattern, Antalgic, Trunk flexed, Decreased stance time - right, Decreased weight shift to right, Decreased stride length       General Gait Details: Min guard A for safety. Slow gait speed. Distance limited by pain, fatigue.   Stairs             Wheelchair Mobility    Modified Rankin (Stroke Patients Only)       Balance Overall balance assessment: Needs assistance         Standing balance support: Bilateral upper extremity supported, During functional activity, Reliant on assistive device for balance Standing balance-Leahy Scale: Fair                              Cognition Arousal/Alertness: Awake/alert Behavior During Therapy: WFL for tasks assessed/performed Overall Cognitive Status: Within Functional Limits for tasks assessed                                          Exercises Total Joint Exercises Ankle Circles/Pumps: AROM, Both, 10 reps Quad Sets: AROM, Both, 10 reps Heel Slides: AAROM, Right, 10 reps Hip ABduction/ADduction: AAROM, Right, 10 reps Straight Leg Raises: AAROM, Right, 10 reps Goniometric ROM: ~10-60 degrees-limited by pain  General Comments        Pertinent Vitals/Pain Pain Assessment Pain Assessment: 0-10 Pain Score: 6  Pain Location: R knee Pain Descriptors / Indicators: Aching, Discomfort, Operative site guarding Pain Intervention(s): Limited activity within patient's tolerance, Monitored during session, Repositioned, Ice applied    Home Living                          Prior Function            PT Goals (current goals can now be found in the care plan section) Progress towards PT goals: Progressing toward  goals    Frequency    7X/week      PT Plan Current plan remains appropriate    Co-evaluation              AM-PAC PT "6 Clicks" Mobility   Outcome Measure  Help needed turning from your back to your side while in a flat bed without using bedrails?: A Little Help needed moving from lying on your back to sitting on the side of a flat bed without using bedrails?: A Little Help needed moving to and from a bed to a chair (including a wheelchair)?: A Little Help needed standing up from a chair using your arms (e.g., wheelchair or bedside chair)?: A Little Help needed to walk in hospital room?: A Little Help needed climbing 3-5 steps with a railing? : A Lot 6 Click Score: 17    End of Session Equipment Utilized During Treatment: Gait belt Activity Tolerance: Patient tolerated treatment well Patient left: in bed;with call bell/phone within reach;with bed alarm set   PT Visit Diagnosis: Other abnormalities of gait and mobility (R26.89);Muscle weakness (generalized) (M62.81);Repeated falls (R29.6) Pain - Right/Left: Right Pain - part of body: Knee     Time: [id number] PT Time Calculation (min) (ACUTE ONLY): 25 min  Charges:  $Gait Training: 8-22 mins $Therapeutic Exercise: 8-22 mins                         Doreatha Massed, PT Acute Rehabilitation  Office: [phone]

## 2023-02-19 NOTE — Plan of Care (Signed)
  Problem: Pain Management: Goal: Pain level will decrease with appropriate interventions Outcome: Progressing   Problem: Activity: Goal: Risk for activity intolerance will decrease Outcome: Progressing   Problem: Safety: Goal: Ability to remain free from injury will improve Outcome: Progressing   

## 2023-02-19 NOTE — Progress Notes (Signed)
Physical Therapy Treatment Patient Details Name: Luis Kim MRN: [id number] DOB: 1946/04/15 Today's Date: 02/19/2023   History of Present Illness 77 yo male presents to therapy s/p R TKA on 02/17/2023 due to failure of conservative measures. Pt PMH includes but is not limited to: chronic pain management,  HTN, ETOH abuse, DM II, DOE, [place] tick fever, hernia repair, R knee sx x 3.    PT Comments    Pt continues to progress well. Moderate pain with activity. Encouraged pt to ambulate often at home. Issued TKA HEP for him to follow if pain control allows. All PT education completed.    Recommendations for follow up therapy are one component of a multi-disciplinary discharge planning process, led by the attending physician.  Recommendations may be updated based on patient status, additional functional criteria and insurance authorization.  Follow Up Recommendations       Assistance Recommended at Discharge Frequent or constant Supervision/Assistance  Patient can return home with the following A little help with walking and/or transfers;A little help with bathing/dressing/bathroom;Assistance with cooking/housework;Assist for transportation;Help with stairs or ramp for entrance   Equipment Recommendations  None recommended by PT    Recommendations for Other Services       Precautions / Restrictions Precautions Precautions: Fall;Knee Restrictions Weight Bearing Restrictions: No RLE Weight Bearing: Weight bearing as tolerated     Mobility  Bed Mobility Overal bed mobility: Needs Assistance Bed Mobility: Supine to Sit, Sit to Supine     Supine to sit: Min guard, HOB elevated Sit to supine: Min guard, HOB elevated   General bed mobility comments: Min guard A for safety. Pt used gait belt as leg lifter to A LE on/off bed. Increased time. Cues provided.    Transfers Overall transfer level: Needs assistance Equipment used: Rolling walker (2 wheels) Transfers: Sit  to/from Stand Sit to Stand: Min guard           General transfer comment: Cues for hand placment and R LE managment.    Ambulation/Gait Ambulation/Gait assistance: Min guard Gait Distance (Feet): 70 Feet Assistive device: Rolling walker (2 wheels) Gait Pattern/deviations: Step-to pattern, Antalgic, Trunk flexed, Decreased stance time - right, Decreased weight shift to right, Decreased stride length       General Gait Details: Min guard A for safety. Slow gait speed. Distance limited by pain, fatigue.   Stairs             Wheelchair Mobility    Modified Rankin (Stroke Patients Only)       Balance Overall balance assessment: Modified Independent         Standing balance support: Bilateral upper extremity supported, During functional activity, Reliant on assistive device for balance Standing balance-Leahy Scale: Fair                              Cognition Arousal/Alertness: Awake/alert Behavior During Therapy: WFL for tasks assessed/performed Overall Cognitive Status: Within Functional Limits for tasks assessed                                          Exercises     General Comments        Pertinent Vitals/Pain Pain Assessment Pain Assessment: 0-10 Pain Score: 6  Pain Location: R knee/thigh Pain Descriptors / Indicators: Aching, Discomfort, Operative site guarding Pain Intervention(s): Limited activity  within patient's tolerance, Monitored during session, Repositioned    Home Living                          Prior Function            PT Goals (current goals can now be found in the care plan section) Progress towards PT goals: Progressing toward goals    Frequency    7X/week      PT Plan Current plan remains appropriate    Co-evaluation              AM-PAC PT "6 Clicks" Mobility   Outcome Measure  Help needed turning from your back to your side while in a flat bed without using  bedrails?: A Little Help needed moving from lying on your back to sitting on the side of a flat bed without using bedrails?: A Little Help needed moving to and from a bed to a chair (including a wheelchair)?: A Little Help needed standing up from a chair using your arms (e.g., wheelchair or bedside chair)?: A Little Help needed to walk in hospital room?: A Little Help needed climbing 3-5 steps with a railing? : A Lot 6 Click Score: 17    End of Session Equipment Utilized During Treatment: Gait belt Activity Tolerance: Patient tolerated treatment well Patient left: in bed;with call bell/phone within reach   PT Visit Diagnosis: Other abnormalities of gait and mobility (R26.89);Muscle weakness (generalized) (M62.81);Repeated falls (R29.6) Pain - Right/Left: Right Pain - part of body: Knee     Time: 1350-1410 PT Time Calculation (min) (ACUTE ONLY): 20 min  Charges:  $Gait Training: 8-22 mins $Therapeutic Exercise: 8-22 mins                         Doreatha Massed, PT Acute Rehabilitation  Office: [phone]

## 2023-02-20 NOTE — Discharge Summary (Signed)
Physician Discharge Summary   Patient ID: Luis Kim MRN: [id number] DOB/AGE: 78-Apr-1947 78 y.o.  Admit date: 02/17/2023 Discharge date: 02/19/2023  Primary Diagnosis: Osteoarthritis right knee    Admission Diagnoses:  Past Medical History:  Diagnosis Date   Anxiety    Arthritis    Depression    Hyperlipidemia    Hypertension    [facility] tick fever 03/25/2014   positive    Stroke ([place])    tia-no residual   Wears glasses    Wears partial dentures    Discharge Diagnoses:   Principal Problem:   OA (osteoarthritis) of knee Active Problems:   Primary osteoarthritis of right knee   Primary osteoarthritis of knee  Estimated body mass index is 25.79 kg/m as calculated from the following:   Height as of this encounter: 5\' 5"  (1.651 m).   Weight as of this encounter: 70.3 kg.  Procedure:  Procedure(s) (LRB): TOTAL KNEE ARTHROPLASTY (Right)   Consults: None  HPI: Luis Kim is a 77 y.o. year old male with end stage OA of his right knee with progressively worsening pain and dysfunction. He has constant pain, with activity and at rest and significant functional deficits with difficulties even with ADLs. He has had extensive non-op management including analgesics, injections of cortisone and viscosupplements, and home exercise program, but remains in significant pain with significant dysfunction. Radiographs show bone on bone arthritis lateral and patellofemoral. He presents now for right Total Knee Arthroplasty.   Laboratory Data: Admission on 02/17/2023, Discharged on 02/19/2023  Component Date Value Ref Range Status   WBC 02/18/2023 12.9 (H)  4.0 - 10.5 K/uL Final   RBC 02/18/2023 2.91 (L)  4.22 - 5.81 MIL/uL Final   Hemoglobin 02/18/2023 9.8 (L)  13.0 - 17.0 g/dL Final   HCT 02/18/2023 29.3 (L)  39.0 - 52.0 % Final   MCV 02/18/2023 100.7 (H)  80.0 - 100.0 fL Final   MCH 02/18/2023 33.7  26.0 - 34.0 pg Final   MCHC 02/18/2023 33.4  30.0 - 36.0 g/dL Final    RDW 02/18/2023 13.6  11.5 - 15.5 % Final   Platelets 02/18/2023 144 (L)  150 - 400 K/uL Final   nRBC 02/18/2023 0.0  0.0 - 0.2 % Final   Performed at [facility], [place] [address] [place], [place] [address]   Sodium 02/18/2023 134 (L)  135 - 145 mmol/L Final   Potassium 02/18/2023 3.7  3.5 - 5.1 mmol/L Final   Chloride 02/18/2023 104  98 - 111 mmol/L Final   CO2 02/18/2023 23  22 - 32 mmol/L Final   Glucose, Bld 02/18/2023 157 (H)  70 - 99 mg/dL Final   Glucose reference range applies only to samples taken after fasting for at least 8 hours.   BUN 02/18/2023 12  8 - 23 mg/dL Final   Creatinine, Ser 02/18/2023 0.69  0.61 - 1.24 mg/dL Final   Calcium 02/18/2023 7.7 (L)  8.9 - 10.3 mg/dL Final   GFR, Estimated 02/18/2023 >60  >60 mL/min Final   Comment: (NOTE) Calculated using the CKD-EPI Creatinine Equation (2021)    Anion gap 02/18/2023 7  5 - 15 Final   Performed at [facility], [place] [address], [place], [place] [address]   WBC 02/19/2023 10.0  4.0 - 10.5 K/uL Final   RBC 02/19/2023 2.43 (L)  4.22 - 5.81 MIL/uL Final   Hemoglobin 02/19/2023 8.3 (L)  13.0 - 17.0 g/dL Final   HCT 02/19/2023 24.9 (  L)  39.0 - 52.0 % Final   MCV 02/19/2023 102.5 (H)  80.0 - 100.0 fL Final   MCH 02/19/2023 34.2 (H)  26.0 - 34.0 pg Final   MCHC 02/19/2023 33.3  30.0 - 36.0 g/dL Final   RDW 02/19/2023 13.7  11.5 - 15.5 % Final   Platelets 02/19/2023 111 (L)  150 - 400 K/uL Final   nRBC 02/19/2023 0.0  0.0 - 0.2 % Final   Performed at [facility], [place] [address], [place], [place] [address]  Hospital Outpatient Visit on 02/04/2023  Component Date Value Ref Range Status   MRSA, PCR 02/04/2023 NEGATIVE  NEGATIVE Final   Staphylococcus aureus 02/04/2023 NEGATIVE  NEGATIVE Final   Comment: (NOTE) The Xpert SA Assay (FDA approved for NASAL specimens in patients 50 years of age and older), is one component of a comprehensive surveillance program. It is  not intended to diagnose infection nor to guide or monitor treatment. Performed at [facility], [place] [address], [place], [place] [address]    Hgb A1c MFr Bld 02/04/2023 5.1  4.8 - 5.6 % Final   Comment: (NOTE)         Prediabetes: 5.7 - 6.4         Diabetes: >6.4         Glycemic control for adults with diabetes: <7.0    Mean Plasma Glucose 02/04/2023 100  mg/dL Final   Comment: (NOTE) Performed At: [place], [place] [id number] Rush Farmer MD [id number]    Sodium 02/04/2023 136  135 - 145 mmol/L Final   Potassium 02/04/2023 4.4  3.5 - 5.1 mmol/L Final   Chloride 02/04/2023 104  98 - 111 mmol/L Final   CO2 02/04/2023 24  22 - 32 mmol/L Final   Glucose, Bld 02/04/2023 103 (H)  70 - 99 mg/dL Final   Glucose reference range applies only to samples taken after fasting for at least 8 hours.   BUN 02/04/2023 16  8 - 23 mg/dL Final   Creatinine, Ser 02/04/2023 1.01  0.61 - 1.24 mg/dL Final   Calcium 02/04/2023 8.5 (L)  8.9 - 10.3 mg/dL Final   GFR, Estimated 02/04/2023 >60  >60 mL/min Final   Comment: (NOTE) Calculated using the CKD-EPI Creatinine Equation (2021)    Anion gap 02/04/2023 8  5 - 15 Final   Performed at [facility], [place] [address], [place], [place] [address]   WBC 02/04/2023 7.0  4.0 - 10.5 K/uL Final   RBC 02/04/2023 3.62 (L)  4.22 - 5.81 MIL/uL Final   Hemoglobin 02/04/2023 12.2 (L)  13.0 - 17.0 g/dL Final   HCT 02/04/2023 36.8 (L)  39.0 - 52.0 % Final   MCV 02/04/2023 101.7 (H)  80.0 - 100.0 fL Final   MCH 02/04/2023 33.7  26.0 - 34.0 pg Final   MCHC 02/04/2023 33.2  30.0 - 36.0 g/dL Final   RDW 02/04/2023 13.6  11.5 - 15.5 % Final   Platelets 02/04/2023 180  150 - 400 K/uL Final   nRBC 02/04/2023 0.0  0.0 - 0.2 % Final   Performed at [facility], [place] [address] [place], [place] [address]     X-Rays:No results found.  EKG: Orders placed or performed during the  hospital encounter of 02/04/23   EKG 12 lead per protocol   EKG 12 lead per protocol     Hospital Course: Luis Kim is a 77 y.o. who was  admitted to [facility]. They were brought to the operating room on 02/17/2023 and underwent Procedure(s): TOTAL KNEE ARTHROPLASTY.  Patient tolerated the procedure well and was later transferred to the recovery room and then to the orthopaedic floor for postoperative care. They were given PO and IV analgesics for pain control following their surgery. They were given 24 hours of postoperative antibiotics of  Anti-infectives (From admission, onward)    Start     Dose/Rate Route Frequency Ordered Stop   02/17/23 1800  ceFAZolin (ANCEF) IVPB 2g/100 mL premix        2 g 200 mL/hr over 30 Minutes Intravenous Every 6 hours 02/17/23 1543 02/17/23 2337   02/17/23 0900  ceFAZolin (ANCEF) IVPB 2g/100 mL premix        2 g 200 mL/hr over 30 Minutes Intravenous On call to O.R. 02/17/23 0848 02/17/23 1129      and started on DVT prophylaxis in the form of Aspirin.   PT and OT were ordered for total joint protocol. Discharge planning consulted to help with post-op disposition and equipment needs. Patient had a fair night on the evening of surgery. They started to get up OOB with physical therapy on POD #0. Continued to work with physical therapy into POD #2. Patient was seen during rounds on day two and was ready to go home pending progress with physical therapy. Patient worked with physical therapy for a total of 5 sessions and was meeting their goals. Dressing was changed and the incision was C/D/I.  They were discharged home later that day in stable condition.  Diet: Regular diet Activity: WBAT Follow-up: in 2 weeks Disposition: Home Discharged Condition: stable   Discharge Instructions     Call MD / Call 911   Complete by: As directed    If you experience chest pain or shortness of breath, CALL 911 and be transported to the hospital emergency  room.  If you develope a fever above 101 F, pus (white drainage) or increased drainage or redness at the wound, or calf pain, call your surgeon's office.   Change dressing   Complete by: As directed    You may remove the bulky bandage (ACE wrap and gauze) two days after surgery. You will have an adhesive waterproof bandage underneath. Leave this in place until your first follow-up appointment.   Constipation Prevention   Complete by: As directed    Drink plenty of fluids.  Prune juice may be helpful.  You may use a stool softener, such as Colace (over the counter) 100 mg twice a day.  Use MiraLax (over the counter) for constipation as needed.   Diet - low sodium heart healthy   Complete by: As directed    Do not put a pillow under the knee. Place it under the heel.   Complete by: As directed    Driving restrictions   Complete by: As directed    No driving for two weeks   Post-operative opioid taper instructions:   Complete by: As directed    POST-OPERATIVE OPIOID TAPER INSTRUCTIONS: It is important to wean off of your opioid medication as soon as possible. If you do not need pain medication after your surgery it is ok to stop day one. Opioids include: Codeine, Hydrocodone(Norco, Vicodin), Oxycodone(Percocet, oxycontin) and hydromorphone amongst others.  Long term and even short term use of opiods can cause: Increased pain response Dependence Constipation Depression Respiratory depression And more.  Withdrawal symptoms can include Flu like symptoms Nausea, vomiting And  more Techniques to manage these symptoms Hydrate well Eat regular healthy meals Stay active Use relaxation techniques(deep breathing, meditating, yoga) Do Not substitute Alcohol to help with tapering If you have been on opioids for less than two weeks and do not have pain than it is ok to stop all together.  Plan to wean off of opioids This plan should start within one week post op of your joint  replacement. Maintain the same interval or time between taking each dose and first decrease the dose.  Cut the total daily intake of opioids by one tablet each day Next start to increase the time between doses. The last dose that should be eliminated is the evening dose.      TED hose   Complete by: As directed    Use stockings (TED hose) for three weeks on both leg(s).  You may remove them at night for sleeping.   Weight bearing as tolerated   Complete by: As directed       Allergies as of 02/19/2023   No Known Allergies      Medication List     TAKE these medications    allopurinol 300 MG tablet Commonly known as: ZYLOPRIM Take 300 mg by mouth daily.   aspirin 81 MG chewable tablet Chew 1 tablet (81 mg total) by mouth 2 (two) times daily for 19 days. Then take one 81 mg aspirin once a day for three weeks. Then discontinue aspirin.   carvedilol 12.5 MG tablet Commonly known as: COREG Take 12.5 mg by mouth 2 (two) times daily with a meal.   fluticasone 50 MCG/ACT nasal spray Commonly known as: FLONASE Place 1 spray into both nostrils daily as needed for allergies or rhinitis.   furosemide 20 MG tablet Commonly known as: LASIX Take 20 mg by mouth daily.   HYDROmorphone 2 MG tablet Commonly known as: DILAUDID Take 1-2 tablets (2-4 mg total) by mouth every 6 (six) hours as needed for severe pain (breakthrough pain not responsive to chronic percocet).   loperamide 2 MG tablet Commonly known as: IMODIUM A-D Take 2 mg by mouth as needed for diarrhea or loose stools.   methocarbamol 500 MG tablet Commonly known as: ROBAXIN Take 1 tablet (500 mg total) by mouth every 6 (six) hours as needed for muscle spasms.   oxyCODONE-acetaminophen 10-325 MG tablet Commonly known as: PERCOCET Take 1 tablet by mouth every 6 (six) hours as needed for pain.   ramipril 10 MG capsule Commonly known as: ALTACE Take 10 mg by mouth daily.   sertraline 50 MG tablet Commonly known as:  ZOLOFT Take 50 mg by mouth daily.   simvastatin 20 MG tablet Commonly known as: ZOCOR Take 20 mg by mouth at bedtime.               Discharge Care Instructions  (From admission, onward)           Start     Ordered   02/19/23 0000  Weight bearing as tolerated        02/19/23 0736   02/19/23 0000  Change dressing       Comments: You may remove the bulky bandage (ACE wrap and gauze) two days after surgery. You will have an adhesive waterproof bandage underneath. Leave this in place until your first follow-up appointment.   02/19/23 0736            Follow-up Information     Gaynelle Arabian, MD. Go on 03/04/2023.   Specialty: Orthopedic  Surgery Why: You are scheduled for first post op appointment on Tuesday April 9 at 1:00pm. Contact information: [address] [place] [address] [phone]                 Signed: R. Jaynie Bream, PA-C Orthopedic Surgery 02/20/2023, 7:26 AM

## 2023-02-21 ENCOUNTER — Other Ambulatory Visit (HOSPITAL_COMMUNITY): Payer: Self-pay

## 2023-02-25 DIAGNOSIS — M1711 Unilateral primary osteoarthritis, right knee: Secondary | ICD-10-CM | POA: Diagnosis not present

## 2023-02-25 DIAGNOSIS — Z4789 Encounter for other orthopedic aftercare: Secondary | ICD-10-CM | POA: Diagnosis not present

## 2023-02-25 DIAGNOSIS — Z96651 Presence of right artificial knee joint: Secondary | ICD-10-CM | POA: Diagnosis not present

## 2023-03-05 DIAGNOSIS — Z96651 Presence of right artificial knee joint: Secondary | ICD-10-CM | POA: Diagnosis not present

## 2023-03-05 DIAGNOSIS — Z4789 Encounter for other orthopedic aftercare: Secondary | ICD-10-CM | POA: Diagnosis not present

## 2023-03-05 DIAGNOSIS — M1711 Unilateral primary osteoarthritis, right knee: Secondary | ICD-10-CM | POA: Diagnosis not present

## 2023-03-06 DIAGNOSIS — M1711 Unilateral primary osteoarthritis, right knee: Secondary | ICD-10-CM | POA: Diagnosis not present

## 2023-03-06 DIAGNOSIS — Z96651 Presence of right artificial knee joint: Secondary | ICD-10-CM | POA: Diagnosis not present

## 2023-03-06 DIAGNOSIS — Z4789 Encounter for other orthopedic aftercare: Secondary | ICD-10-CM | POA: Diagnosis not present

## 2023-03-11 DIAGNOSIS — Z96651 Presence of right artificial knee joint: Secondary | ICD-10-CM | POA: Diagnosis not present

## 2023-03-11 DIAGNOSIS — M1711 Unilateral primary osteoarthritis, right knee: Secondary | ICD-10-CM | POA: Diagnosis not present

## 2023-03-11 DIAGNOSIS — Z4789 Encounter for other orthopedic aftercare: Secondary | ICD-10-CM | POA: Diagnosis not present

## 2023-03-13 DIAGNOSIS — G894 Chronic pain syndrome: Secondary | ICD-10-CM | POA: Diagnosis not present

## 2023-03-13 DIAGNOSIS — M1711 Unilateral primary osteoarthritis, right knee: Secondary | ICD-10-CM | POA: Diagnosis not present

## 2023-03-13 DIAGNOSIS — M25561 Pain in right knee: Secondary | ICD-10-CM | POA: Diagnosis not present

## 2023-03-13 DIAGNOSIS — Z79891 Long term (current) use of opiate analgesic: Secondary | ICD-10-CM | POA: Diagnosis not present

## 2023-03-13 DIAGNOSIS — Z1389 Encounter for screening for other disorder: Secondary | ICD-10-CM | POA: Diagnosis not present

## 2023-03-18 DIAGNOSIS — Z96651 Presence of right artificial knee joint: Secondary | ICD-10-CM | POA: Diagnosis not present

## 2023-03-18 DIAGNOSIS — Z4789 Encounter for other orthopedic aftercare: Secondary | ICD-10-CM | POA: Diagnosis not present

## 2023-03-18 DIAGNOSIS — M1711 Unilateral primary osteoarthritis, right knee: Secondary | ICD-10-CM | POA: Diagnosis not present

## 2023-03-20 DIAGNOSIS — M1711 Unilateral primary osteoarthritis, right knee: Secondary | ICD-10-CM | POA: Diagnosis not present

## 2023-03-20 DIAGNOSIS — Z96651 Presence of right artificial knee joint: Secondary | ICD-10-CM | POA: Diagnosis not present

## 2023-03-20 DIAGNOSIS — Z4789 Encounter for other orthopedic aftercare: Secondary | ICD-10-CM | POA: Diagnosis not present

## 2023-03-25 DIAGNOSIS — M1711 Unilateral primary osteoarthritis, right knee: Secondary | ICD-10-CM | POA: Diagnosis not present

## 2023-03-25 DIAGNOSIS — Z96651 Presence of right artificial knee joint: Secondary | ICD-10-CM | POA: Diagnosis not present

## 2023-03-25 DIAGNOSIS — Z4789 Encounter for other orthopedic aftercare: Secondary | ICD-10-CM | POA: Diagnosis not present

## 2023-03-27 DIAGNOSIS — Z5189 Encounter for other specified aftercare: Secondary | ICD-10-CM | POA: Diagnosis not present

## 2023-03-28 DIAGNOSIS — Z4789 Encounter for other orthopedic aftercare: Secondary | ICD-10-CM | POA: Diagnosis not present

## 2023-03-28 DIAGNOSIS — M1711 Unilateral primary osteoarthritis, right knee: Secondary | ICD-10-CM | POA: Diagnosis not present

## 2023-03-28 DIAGNOSIS — Z96651 Presence of right artificial knee joint: Secondary | ICD-10-CM | POA: Diagnosis not present

## 2023-04-01 DIAGNOSIS — Z4789 Encounter for other orthopedic aftercare: Secondary | ICD-10-CM | POA: Diagnosis not present

## 2023-04-01 DIAGNOSIS — Z96651 Presence of right artificial knee joint: Secondary | ICD-10-CM | POA: Diagnosis not present

## 2023-04-01 DIAGNOSIS — M1711 Unilateral primary osteoarthritis, right knee: Secondary | ICD-10-CM | POA: Diagnosis not present

## 2023-04-03 DIAGNOSIS — Z4789 Encounter for other orthopedic aftercare: Secondary | ICD-10-CM | POA: Diagnosis not present

## 2023-04-03 DIAGNOSIS — Z96651 Presence of right artificial knee joint: Secondary | ICD-10-CM | POA: Diagnosis not present

## 2023-04-03 DIAGNOSIS — M1711 Unilateral primary osteoarthritis, right knee: Secondary | ICD-10-CM | POA: Diagnosis not present

## 2023-04-08 DIAGNOSIS — Z96651 Presence of right artificial knee joint: Secondary | ICD-10-CM | POA: Diagnosis not present

## 2023-04-08 DIAGNOSIS — Z4789 Encounter for other orthopedic aftercare: Secondary | ICD-10-CM | POA: Diagnosis not present

## 2023-04-08 DIAGNOSIS — M1711 Unilateral primary osteoarthritis, right knee: Secondary | ICD-10-CM | POA: Diagnosis not present

## 2023-04-10 DIAGNOSIS — I10 Essential (primary) hypertension: Secondary | ICD-10-CM | POA: Diagnosis not present

## 2023-04-10 DIAGNOSIS — M1711 Unilateral primary osteoarthritis, right knee: Secondary | ICD-10-CM | POA: Diagnosis not present

## 2023-04-10 DIAGNOSIS — M25561 Pain in right knee: Secondary | ICD-10-CM | POA: Diagnosis not present

## 2023-04-10 DIAGNOSIS — G894 Chronic pain syndrome: Secondary | ICD-10-CM | POA: Diagnosis not present

## 2023-04-10 DIAGNOSIS — Z79891 Long term (current) use of opiate analgesic: Secondary | ICD-10-CM | POA: Diagnosis not present

## 2023-04-10 DIAGNOSIS — Z1389 Encounter for screening for other disorder: Secondary | ICD-10-CM | POA: Diagnosis not present

## 2023-04-11 DIAGNOSIS — M1711 Unilateral primary osteoarthritis, right knee: Secondary | ICD-10-CM | POA: Diagnosis not present

## 2023-04-11 DIAGNOSIS — Z96651 Presence of right artificial knee joint: Secondary | ICD-10-CM | POA: Diagnosis not present

## 2023-04-11 DIAGNOSIS — Z4789 Encounter for other orthopedic aftercare: Secondary | ICD-10-CM | POA: Diagnosis not present

## 2023-04-15 DIAGNOSIS — Z96651 Presence of right artificial knee joint: Secondary | ICD-10-CM | POA: Diagnosis not present

## 2023-04-15 DIAGNOSIS — Z4789 Encounter for other orthopedic aftercare: Secondary | ICD-10-CM | POA: Diagnosis not present

## 2023-04-15 DIAGNOSIS — M1711 Unilateral primary osteoarthritis, right knee: Secondary | ICD-10-CM | POA: Diagnosis not present

## 2023-04-17 DIAGNOSIS — Z4789 Encounter for other orthopedic aftercare: Secondary | ICD-10-CM | POA: Diagnosis not present

## 2023-04-17 DIAGNOSIS — M1711 Unilateral primary osteoarthritis, right knee: Secondary | ICD-10-CM | POA: Diagnosis not present

## 2023-04-17 DIAGNOSIS — Z96651 Presence of right artificial knee joint: Secondary | ICD-10-CM | POA: Diagnosis not present

## 2023-04-22 DIAGNOSIS — Z96651 Presence of right artificial knee joint: Secondary | ICD-10-CM | POA: Diagnosis not present

## 2023-04-22 DIAGNOSIS — Z4789 Encounter for other orthopedic aftercare: Secondary | ICD-10-CM | POA: Diagnosis not present

## 2023-04-22 DIAGNOSIS — M1711 Unilateral primary osteoarthritis, right knee: Secondary | ICD-10-CM | POA: Diagnosis not present

## 2023-04-29 DIAGNOSIS — Z4789 Encounter for other orthopedic aftercare: Secondary | ICD-10-CM | POA: Diagnosis not present

## 2023-04-29 DIAGNOSIS — M1711 Unilateral primary osteoarthritis, right knee: Secondary | ICD-10-CM | POA: Diagnosis not present

## 2023-04-29 DIAGNOSIS — Z96651 Presence of right artificial knee joint: Secondary | ICD-10-CM | POA: Diagnosis not present

## 2023-04-30 DIAGNOSIS — Z4789 Encounter for other orthopedic aftercare: Secondary | ICD-10-CM | POA: Diagnosis not present

## 2023-04-30 DIAGNOSIS — M1711 Unilateral primary osteoarthritis, right knee: Secondary | ICD-10-CM | POA: Diagnosis not present

## 2023-04-30 DIAGNOSIS — Z96651 Presence of right artificial knee joint: Secondary | ICD-10-CM | POA: Diagnosis not present

## 2023-05-07 DIAGNOSIS — Z4789 Encounter for other orthopedic aftercare: Secondary | ICD-10-CM | POA: Diagnosis not present

## 2023-05-07 DIAGNOSIS — M1711 Unilateral primary osteoarthritis, right knee: Secondary | ICD-10-CM | POA: Diagnosis not present

## 2023-05-07 DIAGNOSIS — Z96651 Presence of right artificial knee joint: Secondary | ICD-10-CM | POA: Diagnosis not present

## 2023-05-22 DIAGNOSIS — G894 Chronic pain syndrome: Secondary | ICD-10-CM | POA: Diagnosis not present

## 2023-05-22 DIAGNOSIS — M1711 Unilateral primary osteoarthritis, right knee: Secondary | ICD-10-CM | POA: Diagnosis not present

## 2023-05-22 DIAGNOSIS — Z79891 Long term (current) use of opiate analgesic: Secondary | ICD-10-CM | POA: Diagnosis not present

## 2023-05-22 DIAGNOSIS — M25561 Pain in right knee: Secondary | ICD-10-CM | POA: Diagnosis not present

## 2023-05-22 DIAGNOSIS — Z1389 Encounter for screening for other disorder: Secondary | ICD-10-CM | POA: Diagnosis not present

## 2023-06-19 DIAGNOSIS — G894 Chronic pain syndrome: Secondary | ICD-10-CM | POA: Diagnosis not present

## 2023-06-19 DIAGNOSIS — Z1389 Encounter for screening for other disorder: Secondary | ICD-10-CM | POA: Diagnosis not present

## 2023-06-19 DIAGNOSIS — M25561 Pain in right knee: Secondary | ICD-10-CM | POA: Diagnosis not present

## 2023-06-19 DIAGNOSIS — M1711 Unilateral primary osteoarthritis, right knee: Secondary | ICD-10-CM | POA: Diagnosis not present

## 2023-06-19 DIAGNOSIS — Z79891 Long term (current) use of opiate analgesic: Secondary | ICD-10-CM | POA: Diagnosis not present

## 2023-07-17 DIAGNOSIS — M1711 Unilateral primary osteoarthritis, right knee: Secondary | ICD-10-CM | POA: Diagnosis not present

## 2023-07-17 DIAGNOSIS — M549 Dorsalgia, unspecified: Secondary | ICD-10-CM | POA: Diagnosis not present

## 2023-07-17 DIAGNOSIS — G894 Chronic pain syndrome: Secondary | ICD-10-CM | POA: Diagnosis not present

## 2023-07-17 DIAGNOSIS — Z79891 Long term (current) use of opiate analgesic: Secondary | ICD-10-CM | POA: Diagnosis not present

## 2023-07-17 DIAGNOSIS — M25561 Pain in right knee: Secondary | ICD-10-CM | POA: Diagnosis not present

## 2023-08-18 DIAGNOSIS — R600 Localized edema: Secondary | ICD-10-CM | POA: Diagnosis not present

## 2023-08-18 DIAGNOSIS — I10 Essential (primary) hypertension: Secondary | ICD-10-CM | POA: Diagnosis not present

## 2023-08-18 DIAGNOSIS — K580 Irritable bowel syndrome with diarrhea: Secondary | ICD-10-CM | POA: Diagnosis not present

## 2023-08-18 DIAGNOSIS — F325 Major depressive disorder, single episode, in full remission: Secondary | ICD-10-CM | POA: Diagnosis not present

## 2023-08-18 DIAGNOSIS — E7800 Pure hypercholesterolemia, unspecified: Secondary | ICD-10-CM | POA: Diagnosis not present

## 2023-08-18 DIAGNOSIS — Z8639 Personal history of other endocrine, nutritional and metabolic disease: Secondary | ICD-10-CM | POA: Diagnosis not present

## 2023-08-18 DIAGNOSIS — M545 Low back pain, unspecified: Secondary | ICD-10-CM | POA: Diagnosis not present

## 2023-08-18 DIAGNOSIS — M109 Gout, unspecified: Secondary | ICD-10-CM | POA: Diagnosis not present

## 2023-08-18 DIAGNOSIS — Z139 Encounter for screening, unspecified: Secondary | ICD-10-CM | POA: Diagnosis not present

## 2023-08-21 DIAGNOSIS — M545 Low back pain, unspecified: Secondary | ICD-10-CM | POA: Diagnosis not present

## 2023-08-28 DIAGNOSIS — M1711 Unilateral primary osteoarthritis, right knee: Secondary | ICD-10-CM | POA: Diagnosis not present

## 2023-08-28 DIAGNOSIS — Z79891 Long term (current) use of opiate analgesic: Secondary | ICD-10-CM | POA: Diagnosis not present

## 2023-08-28 DIAGNOSIS — Z96659 Presence of unspecified artificial knee joint: Secondary | ICD-10-CM | POA: Diagnosis not present

## 2023-08-28 DIAGNOSIS — M25561 Pain in right knee: Secondary | ICD-10-CM | POA: Diagnosis not present

## 2023-08-28 DIAGNOSIS — M549 Dorsalgia, unspecified: Secondary | ICD-10-CM | POA: Diagnosis not present

## 2023-08-28 DIAGNOSIS — G894 Chronic pain syndrome: Secondary | ICD-10-CM | POA: Diagnosis not present

## 2023-09-19 DIAGNOSIS — N289 Disorder of kidney and ureter, unspecified: Secondary | ICD-10-CM | POA: Diagnosis not present

## 2023-09-23 DIAGNOSIS — R5383 Other fatigue: Secondary | ICD-10-CM | POA: Diagnosis not present

## 2023-09-23 DIAGNOSIS — D649 Anemia, unspecified: Secondary | ICD-10-CM | POA: Diagnosis not present

## 2023-09-23 DIAGNOSIS — I10 Essential (primary) hypertension: Secondary | ICD-10-CM | POA: Diagnosis not present

## 2023-09-23 DIAGNOSIS — E871 Hypo-osmolality and hyponatremia: Secondary | ICD-10-CM | POA: Diagnosis not present

## 2023-09-23 DIAGNOSIS — E875 Hyperkalemia: Secondary | ICD-10-CM | POA: Diagnosis not present

## 2023-09-23 DIAGNOSIS — K580 Irritable bowel syndrome with diarrhea: Secondary | ICD-10-CM | POA: Diagnosis not present

## 2023-10-02 DIAGNOSIS — M549 Dorsalgia, unspecified: Secondary | ICD-10-CM | POA: Diagnosis not present

## 2023-10-02 DIAGNOSIS — M1711 Unilateral primary osteoarthritis, right knee: Secondary | ICD-10-CM | POA: Diagnosis not present

## 2023-10-02 DIAGNOSIS — Z1389 Encounter for screening for other disorder: Secondary | ICD-10-CM | POA: Diagnosis not present

## 2023-10-02 DIAGNOSIS — M25561 Pain in right knee: Secondary | ICD-10-CM | POA: Diagnosis not present

## 2023-10-02 DIAGNOSIS — Z79891 Long term (current) use of opiate analgesic: Secondary | ICD-10-CM | POA: Diagnosis not present

## 2023-10-02 DIAGNOSIS — G894 Chronic pain syndrome: Secondary | ICD-10-CM | POA: Diagnosis not present

## 2023-10-02 DIAGNOSIS — Z96659 Presence of unspecified artificial knee joint: Secondary | ICD-10-CM | POA: Diagnosis not present

## 2023-10-30 DIAGNOSIS — Z79891 Long term (current) use of opiate analgesic: Secondary | ICD-10-CM | POA: Diagnosis not present

## 2023-10-30 DIAGNOSIS — M549 Dorsalgia, unspecified: Secondary | ICD-10-CM | POA: Diagnosis not present

## 2023-10-30 DIAGNOSIS — Z96659 Presence of unspecified artificial knee joint: Secondary | ICD-10-CM | POA: Diagnosis not present

## 2023-10-30 DIAGNOSIS — G894 Chronic pain syndrome: Secondary | ICD-10-CM | POA: Diagnosis not present

## 2023-10-30 DIAGNOSIS — M25561 Pain in right knee: Secondary | ICD-10-CM | POA: Diagnosis not present

## 2023-10-30 DIAGNOSIS — M1711 Unilateral primary osteoarthritis, right knee: Secondary | ICD-10-CM | POA: Diagnosis not present

## 2023-11-10 DIAGNOSIS — Z20822 Contact with and (suspected) exposure to covid-19: Secondary | ICD-10-CM | POA: Diagnosis not present

## 2023-11-10 DIAGNOSIS — J180 Bronchopneumonia, unspecified organism: Secondary | ICD-10-CM | POA: Diagnosis not present

## 2023-11-10 DIAGNOSIS — J189 Pneumonia, unspecified organism: Secondary | ICD-10-CM | POA: Diagnosis not present
